# Patient Record
Sex: Female | Born: 1960 | Race: White | Hispanic: No | Marital: Married
Health system: Midwestern US, Academic
[De-identification: ages and names within clinical notes are randomized; demographics above are authoritative.]

---

## 2016-09-12 ENCOUNTER — Encounter: Admit: 2016-09-12 | Discharge: 2016-09-12 | Payer: MEDICARE

## 2016-09-12 NOTE — Progress Notes
SW received call from [place] with [facility] requesting update for patient's donor family (from 2015 transplant).  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    Lynetta Mare, LMSW

## 2016-09-20 ENCOUNTER — Encounter: Admit: 2016-09-20 | Discharge: 2016-09-20 | Payer: MEDICARE

## 2016-11-14 ENCOUNTER — Ambulatory Visit: Admit: 2016-11-13 | Discharge: 2016-11-14 | Payer: MEDICARE

## 2016-11-14 DIAGNOSIS — N186 End stage renal disease: Principal | ICD-10-CM

## 2016-11-15 ENCOUNTER — Encounter: Admit: 2016-11-15 | Discharge: 2016-11-15 | Payer: MEDICARE

## 2016-11-15 DIAGNOSIS — Z01818 Encounter for other preprocedural examination: ICD-10-CM

## 2016-11-15 DIAGNOSIS — N186 End stage renal disease: Principal | ICD-10-CM

## 2016-11-22 ENCOUNTER — Ambulatory Visit: Admit: 2016-11-22 | Discharge: 2016-11-22 | Payer: Medicare Other

## 2016-11-22 DIAGNOSIS — N186 End stage renal disease: Principal | ICD-10-CM

## 2016-11-22 DIAGNOSIS — Z01818 Encounter for other preprocedural examination: ICD-10-CM

## 2016-11-22 NOTE — Telephone Encounter
11/22/16- Received VM from pt. Per VM, she has sched stress and echo but has question about which test was ordered.  Attempted to contact pt, no answer and unable to leave VM.   Noted that pt is sched for echo today and stress on 9/19.

## 2016-11-25 ENCOUNTER — Encounter: Admit: 2016-11-25 | Discharge: 2016-11-25 | Payer: MEDICARE

## 2016-11-27 ENCOUNTER — Ambulatory Visit: Admit: 2016-11-27 | Discharge: 2016-11-27 | Payer: MEDICARE

## 2016-11-27 ENCOUNTER — Encounter: Admit: 2016-11-27 | Discharge: 2016-11-27 | Payer: MEDICARE

## 2016-11-27 DIAGNOSIS — N186 End stage renal disease: Principal | ICD-10-CM

## 2016-11-27 DIAGNOSIS — Z01818 Encounter for other preprocedural examination: ICD-10-CM

## 2016-11-27 MED ORDER — SODIUM CHLORIDE 0.9 % IV SOLP
250 mL | INTRAVENOUS | 0 refills | Status: AC | PRN
Start: 2016-11-27 — End: ?

## 2016-11-27 MED ORDER — NITROGLYCERIN 0.4 MG SL SUBL
.4 mg | SUBLINGUAL | 0 refills | Status: AC | PRN
Start: 2016-11-27 — End: ?

## 2016-11-27 MED ORDER — REGADENOSON 0.4 MG/5 ML IV SYRG
.4 mg | Freq: Once | INTRAVENOUS | 0 refills | Status: CP
Start: 2016-11-27 — End: ?
  Administered 2016-11-27: 16:00:00 0.4 mg via INTRAVENOUS

## 2016-11-27 MED ORDER — ALBUTEROL SULFATE 90 MCG/ACTUATION IN HFAA
2 | RESPIRATORY_TRACT | 0 refills | Status: DC | PRN
Start: 2016-11-27 — End: 2016-12-02

## 2016-11-27 MED ORDER — AMINOPHYLLINE 500 MG/20 ML IV SOLN
50 mg | INTRAVENOUS | 0 refills | Status: AC | PRN
Start: 2016-11-27 — End: ?
  Administered 2016-11-27: 16:00:00 25 mg via INTRAVENOUS

## 2016-11-28 ENCOUNTER — Encounter: Admit: 2016-11-28 | Discharge: 2016-11-28 | Payer: MEDICARE

## 2016-11-29 ENCOUNTER — Encounter: Admit: 2016-11-29 | Discharge: 2016-11-29 | Payer: MEDICARE

## 2016-12-03 NOTE — Telephone Encounter
12/03/16- Received letter from Dr. Trudee Kuster re: recent cardiac testing with the following note from Dr. Caryl Comes:  We are ok with annual stress/echo.

## 2016-12-04 ENCOUNTER — Encounter: Admit: 2016-12-04 | Discharge: 2016-12-04 | Payer: MEDICARE

## 2016-12-04 NOTE — Telephone Encounter
Reviewed CT only in selection committee. 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

## 2016-12-31 ENCOUNTER — Encounter: Admit: 2016-12-31 | Discharge: 2016-12-31 | Payer: MEDICARE

## 2017-01-21 ENCOUNTER — Encounter: Admit: 2017-01-21 | Discharge: 2017-01-21 | Payer: MEDICARE

## 2017-01-21 NOTE — Progress Notes
Records sent to [facility] for long term storage.  Box Number [id number]

## 2017-01-28 ENCOUNTER — Encounter: Admit: 2017-01-28 | Discharge: 2017-01-28 | Payer: MEDICARE

## 2017-01-28 NOTE — Telephone Encounter
Called pt to get her scheduled for an update appt no answer and no VM set up.

## 2017-01-28 NOTE — Telephone Encounter
Called patient to get update appointment scheduled.  No answer, phone rang continuously, unable to LVM.

## 2017-01-29 ENCOUNTER — Encounter: Admit: 2017-01-29 | Discharge: 2017-01-29 | Payer: MEDICARE

## 2017-01-29 NOTE — Telephone Encounter
Called pt to try and get her scheduled for an update appt no answer and no VM set up.

## 2017-02-03 ENCOUNTER — Encounter: Admit: 2017-02-03 | Discharge: 2017-02-03 | Payer: MEDICARE

## 2017-02-03 NOTE — Telephone Encounter
Tried to call pt to get her scheduled for an update appt no answer no VM set up.

## 2017-02-10 ENCOUNTER — Encounter: Admit: 2017-02-10 | Discharge: 2017-02-10 | Payer: MEDICARE

## 2017-02-10 NOTE — Telephone Encounter
Called pt to try and get her scheduled for an update appt no answer no VM set up.

## 2017-02-27 ENCOUNTER — Encounter: Admit: 2017-02-27 | Discharge: 2017-02-27 | Payer: MEDICARE

## 2017-02-27 NOTE — Telephone Encounter
Called to schedule patient for update appointment.  No answer and unable to leave VM on preferred number. Called mobile number and scheduled patient for 05/07/2017. Advised to be at clinic by 7:45, bring a support person, and that labs were not fasting labs so patient was encouraged to eat.  Patient verbalized understanding and had no other questions.

## 2017-03-11 IMAGING — CT Abdomen^1_ABDOMEN_PELVIS_WITHOUT (Adult)
1 series · 15 of 32 positions shown, 19 images · non-contrast
Comparison: none

[Series 2: abd/pelvis w/o 5.0 soft tissue · axial · non-contrast · 0.88mm/px · z∈[-408,+48]mm · 15 of 99 slices shown, 19 images]
[im 7/99  soft-tissue]
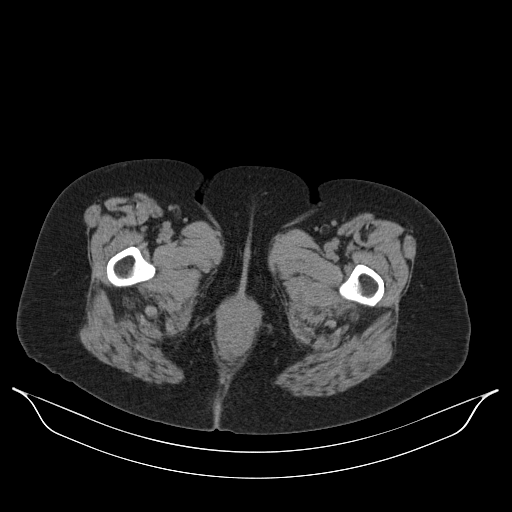
[im 7/99  bone]
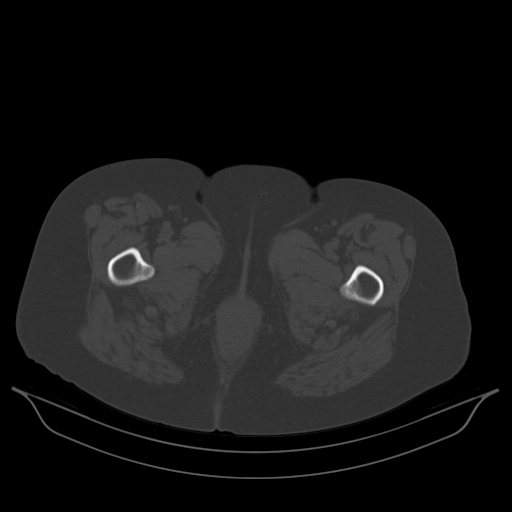
[im 13/99  soft-tissue]
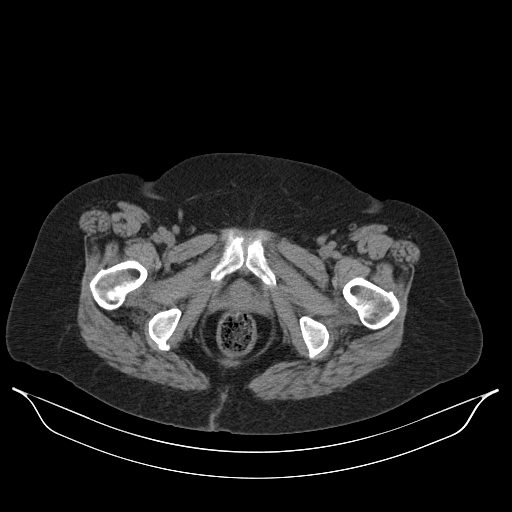
[im 19/99  soft-tissue]
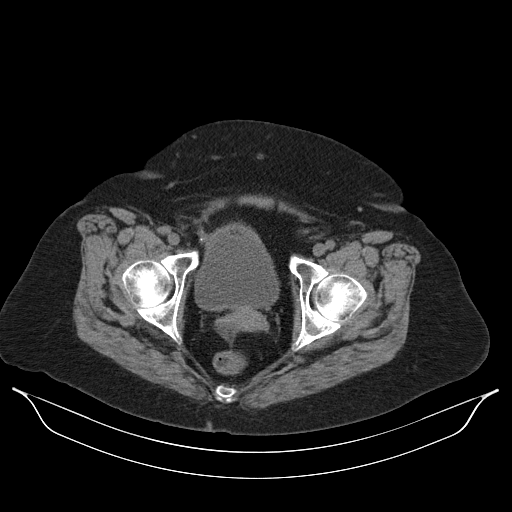
[im 29/99  soft-tissue]
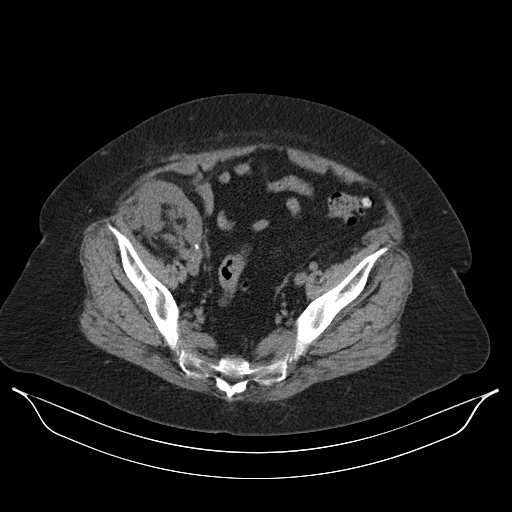
[im 35/99  soft-tissue]
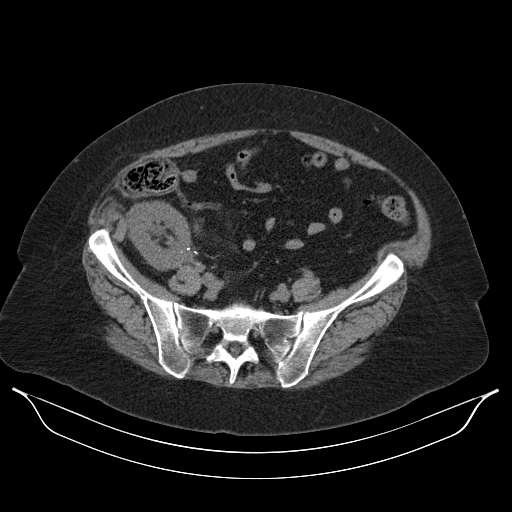
[im 42/99  soft-tissue]
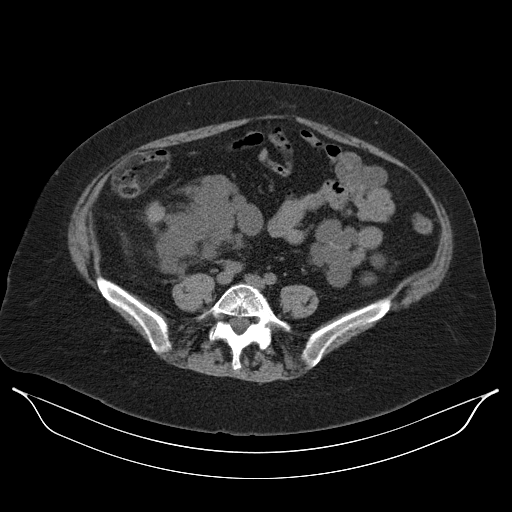
[im 51/99  soft-tissue]
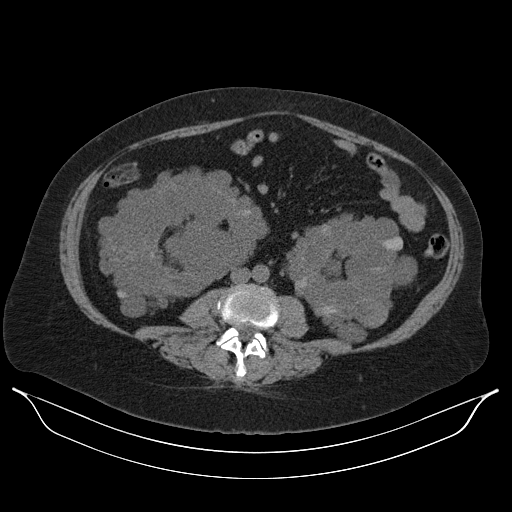
[im 57/99  soft-tissue]
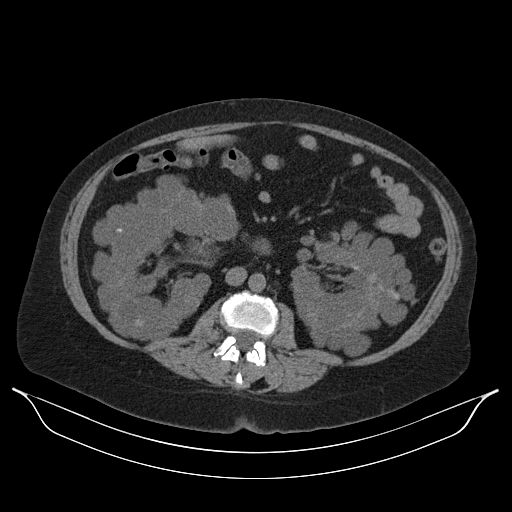
[im 64/99  soft-tissue]
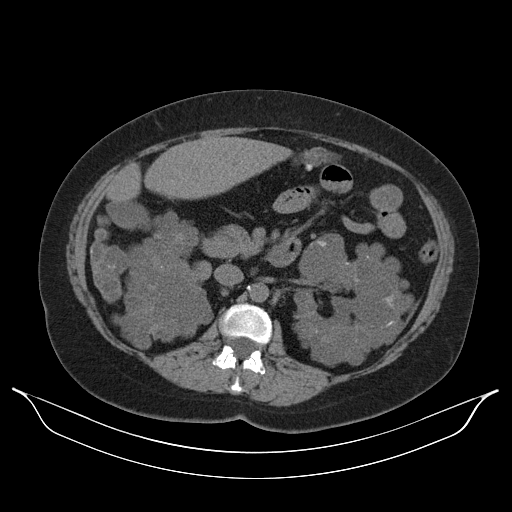
[im 64/99  bone]
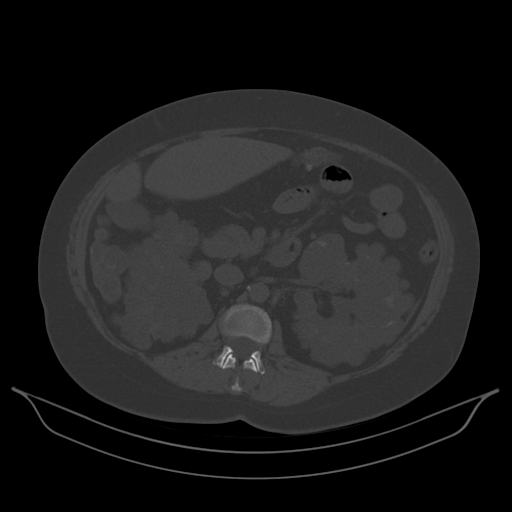
[im 70/99  soft-tissue]
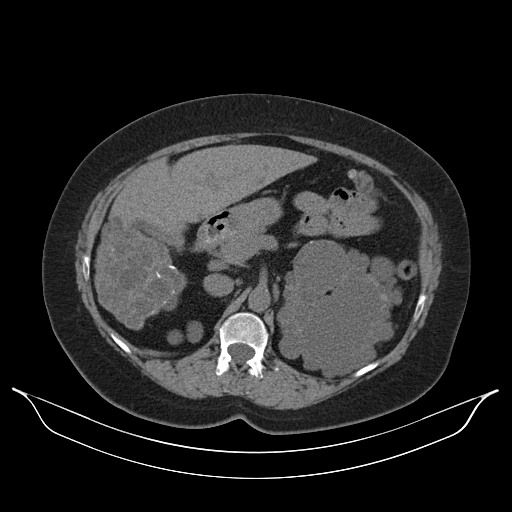
[im 80/99  soft-tissue]
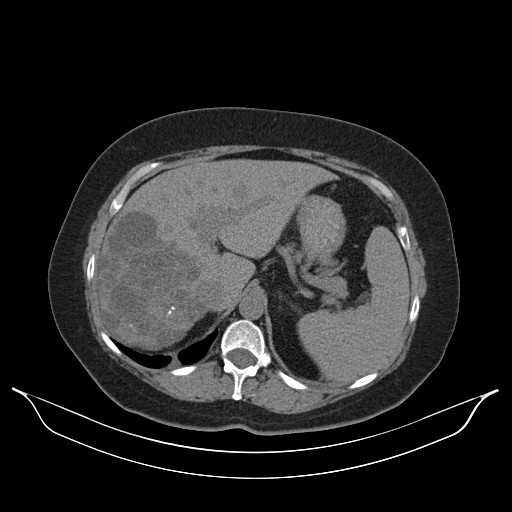
[im 86/99  soft-tissue]
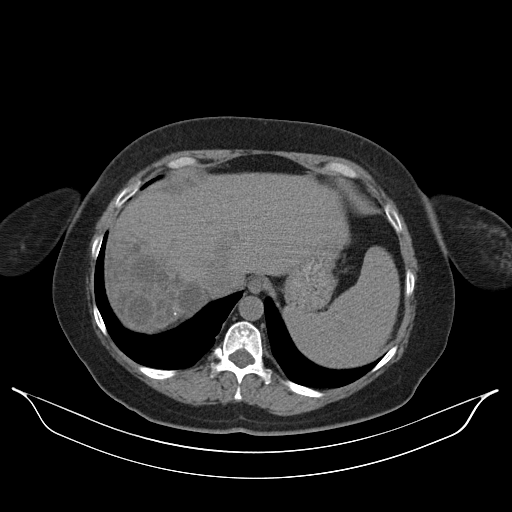
[im 86/99  lung]
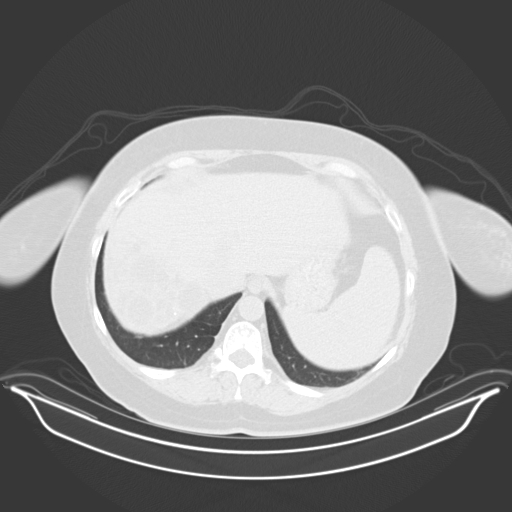
[im 89/99  lung]
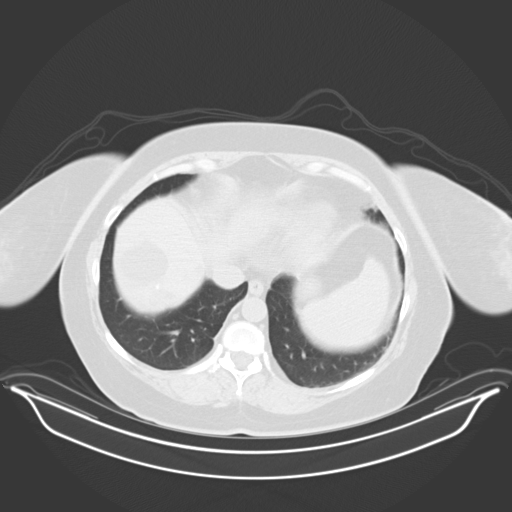
[im 92/99  soft-tissue]
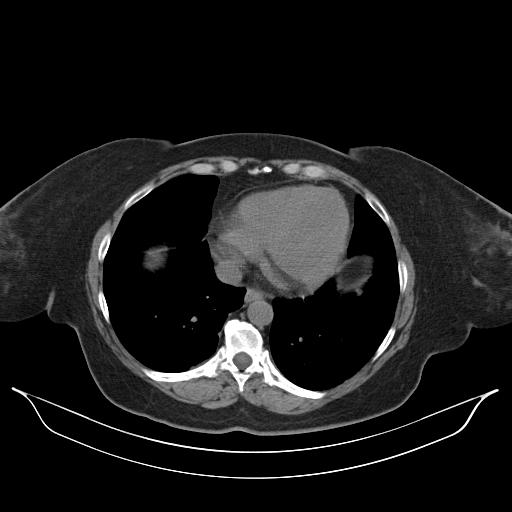
[im 92/99  lung]
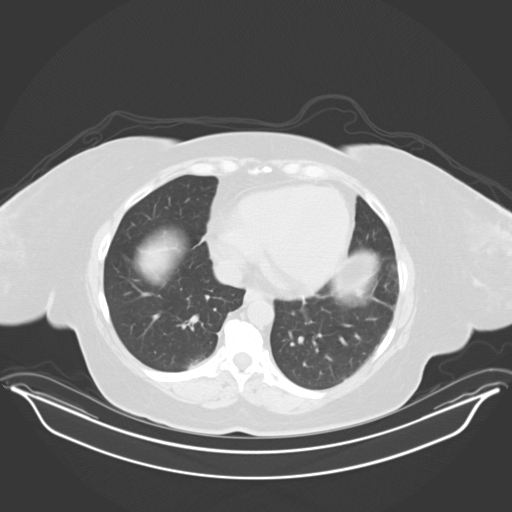
[im 95/99  lung]
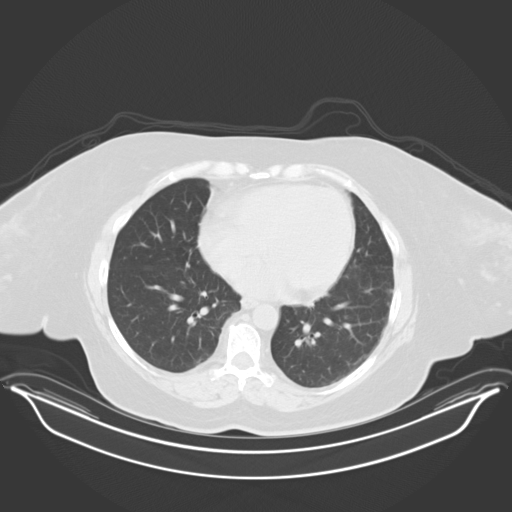

[15 of 32 positions shown; findings below may reference images not displayed]

DIAGNOSTIC STUDIES

EXAM
CT abdomen and pelvis without contrast

INDICATION
Right flank pain, hematuria; polycystic kidneys s/p transplant
PAIN SINCE 2AM.

TECHNIQUE
Volumetric multi detector CT images of the abdomen and pelvis were obtained without the
administration of IV contrast.

COMPARISONS
None available]

FINDINGS
The visualized lung bases are clear. There are markedly enlarged polycystic appearing bilateral
kidneys. There is additional evidence of extensive cystic disease within the right liver lobe with
scattered peripheral calcifications. There is a transplanted kidney identified in the right lower
quadrant. There is no definite evidence of obstructive uropathy of the implanted kidney. There is
colonic diverticulosis without evidence of diverticulitis. There is a cystic mass lesion seen
within the body of the pancreas measuring 1.1 centimeters in greatest dimension. There is no free
air or free fluid. There is no mesenteric or pelvic adenopathy. The appendix is normal in the right
lower quadrant. The remaining hollow and solid abdominal and pelvic viscera are grossly
unremarkable. There is no acute osseous abnormality. There is mild multilevel degenerative changes
of the lumbar spine.

IMPRESSION
1. Demonstration of large polycystic appearing bilateral kidneys with additional polycystic
disease of the right liver lobe.
2. No evidence of definite obstructive uropathy.
3. Demonstration of 1.1 centimeters cystic mass lesion within the mid body of the pancreas which
may represent a small mucinous neoplasm. Further evaluation with MRI can be useful for further
characterization.

## 2017-03-18 ENCOUNTER — Ambulatory Visit: Admit: 2017-03-18 | Discharge: 2017-03-19 | Payer: MEDICARE

## 2017-04-01 ENCOUNTER — Encounter: Admit: 2017-04-01 | Discharge: 2017-04-01 | Payer: MEDICARE

## 2017-04-03 ENCOUNTER — Encounter: Admit: 2017-04-03 | Discharge: 2017-04-03 | Payer: MEDICARE

## 2017-04-03 DIAGNOSIS — Z01818 Encounter for other preprocedural examination: Principal | ICD-10-CM

## 2017-04-18 ENCOUNTER — Encounter: Admit: 2017-04-18 | Discharge: 2017-04-18 | Payer: MEDICARE

## 2017-05-06 ENCOUNTER — Encounter: Admit: 2017-05-06 | Discharge: 2017-05-06 | Payer: MEDICARE

## 2017-05-07 ENCOUNTER — Ambulatory Visit: Admit: 2017-05-07 | Discharge: 2017-05-07 | Payer: MEDICARE

## 2017-05-07 ENCOUNTER — Encounter: Admit: 2017-05-07 | Discharge: 2017-05-07 | Payer: MEDICARE

## 2017-05-07 DIAGNOSIS — Z0389 Encounter for observation for other suspected diseases and conditions ruled out: ICD-10-CM

## 2017-05-07 DIAGNOSIS — Z01818 Encounter for other preprocedural examination: Principal | ICD-10-CM

## 2017-05-07 DIAGNOSIS — N186 End stage renal disease: ICD-10-CM

## 2017-05-07 DIAGNOSIS — Z91013 Allergy to seafood: ICD-10-CM

## 2017-05-07 DIAGNOSIS — Z9889 Other specified postprocedural states: ICD-10-CM

## 2017-05-07 DIAGNOSIS — I10 Essential (primary) hypertension: Secondary | ICD-10-CM

## 2017-05-07 DIAGNOSIS — Q613 Polycystic kidney, unspecified: ICD-10-CM

## 2017-05-07 DIAGNOSIS — E785 Hyperlipidemia, unspecified: ICD-10-CM

## 2017-05-07 DIAGNOSIS — E1129 Type 2 diabetes mellitus with other diabetic kidney complication: Secondary | ICD-10-CM

## 2017-05-07 DIAGNOSIS — Z114 Encounter for screening for human immunodeficiency virus [HIV]: ICD-10-CM

## 2017-05-07 DIAGNOSIS — Z992 Dependence on renal dialysis: ICD-10-CM

## 2017-05-07 LAB — URINALYSIS DIPSTICK
Lab: 1 (ref 1.003–1.035)
Lab: 5 (ref 5.0–8.0)
Lab: NEGATIVE
Lab: NEGATIVE
Lab: NEGATIVE
Lab: NEGATIVE
Lab: NEGATIVE
Lab: NEGATIVE
Lab: NEGATIVE

## 2017-05-07 LAB — PROTIME INR (PT): Lab: 1 (ref 0.8–1.2)

## 2017-05-07 LAB — HEMOGLOBIN A1C: Lab: 5.4 % (ref 4.0–6.0)

## 2017-05-07 LAB — HERPES SIMPLEX IGG AB (HSV IGG)
Lab: NEGATIVE
Lab: POSITIVE — AB

## 2017-05-07 LAB — CBC AND DIFF
Lab: 1 % — ABNORMAL LOW (ref >60)
Lab: 12 % — ABNORMAL LOW (ref 24–44)
Lab: 15 % — ABNORMAL HIGH (ref 11–15)
Lab: 30 pg (ref 26–34)
Lab: 37 % — ABNORMAL HIGH (ref 36–45)
Lab: 4 M/UL — ABNORMAL HIGH (ref 4.0–5.0)
Lab: 4.4 10*3/uL (ref 1.8–7.0)
Lab: 5.4 10*3/uL (ref 4.5–11.0)
Lab: 7.9 FL (ref 7–11)

## 2017-05-07 LAB — OPIATES-URINE RANDOM: Lab: NEGATIVE FL (ref 80–100)

## 2017-05-07 LAB — HIV 1& 2 AG-AB SCRN W REFLEX HIV 1 PCR QUANT: Lab: NEGATIVE

## 2017-05-07 LAB — TOXOPLASMA IGG: Lab: NEGATIVE

## 2017-05-07 LAB — CANNABINOIDS-URINE RANDOM: Lab: NEGATIVE 10*3/uL (ref 150–400)

## 2017-05-07 LAB — HEPATITIS B SURFACE AG: Lab: NEGATIVE

## 2017-05-07 LAB — BARBITURATES-URINE RANDOM: Lab: NEGATIVE % (ref 4–12)

## 2017-05-07 LAB — PHENCYCLIDINES-URINE RANDOM: Lab: NEGATIVE g/dL — ABNORMAL HIGH (ref 12.0–15.0)

## 2017-05-07 LAB — HEPATITIS B CORE AB TOT (IGG+IGM): Lab: NEGATIVE

## 2017-05-07 LAB — SYPHILIS AB SCREEN: Lab: NEGATIVE

## 2017-05-07 LAB — URINALYSIS, MICROSCOPIC

## 2017-05-07 LAB — COMPREHENSIVE METABOLIC PANEL
Lab: 138 MMOL/L (ref 137–147)
Lab: 4.1 MMOL/L (ref 3.5–5.1)

## 2017-05-07 LAB — PHOSPHORUS: Lab: 3.9 mg/dL (ref 2.0–4.5)

## 2017-05-07 LAB — HEPATITIS C ANTIBODY W REFLEX HCV PCR QUANT: Lab: NEGATIVE

## 2017-05-07 LAB — GGTP: Lab: 20 U/L (ref 9–64)

## 2017-05-07 LAB — AMPHETAMINES-URINE RANDOM: Lab: NEGATIVE % — ABNORMAL LOW (ref 0–2)

## 2017-05-07 LAB — BENZODIAZEPINES-URINE RANDOM: Lab: NEGATIVE % — ABNORMAL HIGH (ref 41–77)

## 2017-05-07 LAB — COCAINE-URINE RANDOM: Lab: NEGATIVE g/dL (ref 32.0–36.0)

## 2017-05-07 LAB — EPSTEIN BARR  PANEL(EBV)
Lab: POSITIVE
Lab: POSITIVE

## 2017-05-07 LAB — HEPATITIS B SURFACE AB: Lab: 62 m[IU]/mL

## 2017-05-07 LAB — TOXOPLASMA IGM: Lab: NEGATIVE

## 2017-05-08 ENCOUNTER — Encounter: Admit: 2017-05-08 | Discharge: 2017-05-08 | Payer: MEDICARE

## 2017-05-09 ENCOUNTER — Encounter: Admit: 2017-05-09 | Discharge: 2017-05-09 | Payer: MEDICARE

## 2017-05-13 ENCOUNTER — Encounter: Admit: 2017-05-13 | Discharge: 2017-05-13 | Payer: MEDICARE

## 2017-05-14 ENCOUNTER — Encounter: Admit: 2017-05-14 | Discharge: 2017-05-14 | Payer: MEDICARE

## 2017-05-14 DIAGNOSIS — Z992 Dependence on renal dialysis: ICD-10-CM

## 2017-05-14 DIAGNOSIS — I10 Essential (primary) hypertension: Secondary | ICD-10-CM

## 2017-05-14 DIAGNOSIS — Z91013 Allergy to seafood: ICD-10-CM

## 2017-05-14 DIAGNOSIS — Z9889 Other specified postprocedural states: ICD-10-CM

## 2017-05-14 DIAGNOSIS — E785 Hyperlipidemia, unspecified: ICD-10-CM

## 2017-05-14 DIAGNOSIS — N186 End stage renal disease: ICD-10-CM

## 2017-05-14 DIAGNOSIS — Q613 Polycystic kidney, unspecified: ICD-10-CM

## 2017-05-16 ENCOUNTER — Encounter: Admit: 2017-05-16 | Discharge: 2017-05-16 | Payer: MEDICARE

## 2017-05-26 ENCOUNTER — Encounter: Admit: 2017-05-26 | Discharge: 2017-05-26 | Payer: MEDICARE

## 2017-05-26 MED ORDER — PREDNISONE 5 MG PO TAB
5 mg | ORAL_TABLET | Freq: Every day | ORAL | 11 refills | Status: AC
Start: 2017-05-26 — End: 2018-06-17

## 2017-05-26 MED ORDER — TACROLIMUS 1 MG PO CAP
1 mg | ORAL_CAPSULE | Freq: Two times a day (BID) | ORAL | 11 refills | 30.00000 days | Status: AC
Start: 2017-05-26 — End: 2017-09-01

## 2017-05-26 MED ORDER — TACROLIMUS 0.5 MG PO CAP
.5 mg | ORAL_CAPSULE | Freq: Every day | ORAL | 11 refills | 30.00000 days | Status: AC
Start: 2017-05-26 — End: 2018-06-17

## 2017-05-30 ENCOUNTER — Encounter: Admit: 2017-05-30 | Discharge: 2017-05-30 | Payer: MEDICARE

## 2017-06-06 ENCOUNTER — Encounter: Admit: 2017-06-06 | Discharge: 2017-06-06 | Payer: MEDICARE

## 2017-06-16 ENCOUNTER — Ambulatory Visit: Admit: 2017-06-16 | Discharge: 2017-06-17 | Payer: MEDICARE

## 2017-06-17 DIAGNOSIS — N186 End stage renal disease: Principal | ICD-10-CM

## 2017-06-27 ENCOUNTER — Encounter: Admit: 2017-06-27 | Discharge: 2017-06-27 | Payer: MEDICARE

## 2017-07-01 ENCOUNTER — Encounter: Admit: 2017-07-01 | Discharge: 2017-07-01 | Payer: MEDICARE

## 2017-07-01 MED ORDER — AZATHIOPRINE 50 MG PO TAB
50 mg | ORAL_TABLET | Freq: Every day | ORAL | 11 refills | Status: AC
Start: 2017-07-01 — End: 2017-07-01

## 2017-07-01 MED ORDER — AZATHIOPRINE 50 MG PO TAB
50 mg | ORAL_TABLET | Freq: Every day | ORAL | 11 refills | Status: AC
Start: 2017-07-01 — End: 2018-06-17

## 2017-09-01 ENCOUNTER — Encounter: Admit: 2017-09-01 | Discharge: 2017-09-01 | Payer: MEDICARE

## 2017-09-01 MED ORDER — PROGRAF 1 MG PO CAP
1 mg | ORAL_CAPSULE | Freq: Two times a day (BID) | ORAL | 11 refills | 30.00000 days | Status: AC
Start: 2017-09-01 — End: 2018-06-17

## 2017-09-16 ENCOUNTER — Ambulatory Visit: Admit: 2017-09-16 | Discharge: 2017-09-17 | Payer: MEDICARE

## 2017-09-17 DIAGNOSIS — N186 End stage renal disease: Principal | ICD-10-CM

## 2017-10-14 ENCOUNTER — Encounter: Admit: 2017-10-14 | Discharge: 2017-10-14 | Payer: MEDICARE

## 2017-11-12 ENCOUNTER — Encounter: Admit: 2017-11-12 | Discharge: 2017-11-12 | Payer: MEDICARE

## 2017-12-29 ENCOUNTER — Encounter: Admit: 2017-12-29 | Discharge: 2017-12-29 | Payer: MEDICARE

## 2017-12-29 ENCOUNTER — Ambulatory Visit: Admit: 2017-12-29 | Discharge: 2017-12-30 | Payer: MEDICARE

## 2018-01-05 ENCOUNTER — Encounter: Admit: 2018-01-05 | Discharge: 2018-01-05 | Payer: MEDICARE

## 2018-02-11 ENCOUNTER — Ambulatory Visit: Admit: 2018-02-11 | Discharge: 2018-02-12 | Payer: MEDICARE

## 2018-02-16 ENCOUNTER — Encounter: Admit: 2018-02-16 | Discharge: 2018-02-16 | Payer: MEDICARE

## 2018-02-16 DIAGNOSIS — Z01818 Encounter for other preprocedural examination: Principal | ICD-10-CM

## 2018-02-16 DIAGNOSIS — N186 End stage renal disease: ICD-10-CM

## 2018-02-19 ENCOUNTER — Encounter: Admit: 2018-02-19 | Discharge: 2018-02-19 | Payer: MEDICARE

## 2018-02-27 ENCOUNTER — Encounter: Admit: 2018-02-27 | Discharge: 2018-02-27 | Payer: MEDICARE

## 2018-02-27 DIAGNOSIS — Z005 Encounter for examination of potential donor of organ and tissue: Principal | ICD-10-CM

## 2018-03-05 ENCOUNTER — Encounter: Admit: 2018-03-05 | Discharge: 2018-03-05 | Payer: MEDICARE

## 2018-03-20 ENCOUNTER — Encounter: Admit: 2018-03-20 | Discharge: 2018-03-20 | Payer: MEDICARE

## 2018-03-23 ENCOUNTER — Ambulatory Visit: Admit: 2018-03-23 | Discharge: 2018-03-23

## 2018-03-23 ENCOUNTER — Ambulatory Visit: Admit: 2018-03-23 | Discharge: 2018-03-24 | Payer: MEDICARE

## 2018-03-23 DIAGNOSIS — Z01818 Encounter for other preprocedural examination: Secondary | ICD-10-CM

## 2018-03-23 DIAGNOSIS — N186 End stage renal disease: Secondary | ICD-10-CM

## 2018-03-23 MED ORDER — SODIUM CHLORIDE 0.9 % IV SOLP
250 mL | INTRAVENOUS | 0 refills | Status: AC | PRN
Start: 2018-03-23 — End: ?

## 2018-03-23 MED ORDER — BENZOCAINE-MENTHOL 6-10 MG MM LOZG
1 | Freq: Once | ORAL | 0 refills | Status: AC | PRN
Start: 2018-03-23 — End: ?

## 2018-03-23 MED ORDER — PERFLUTREN LIPID MICROSPHERES 1.1 MG/ML IV SUSP
1-20 mL | Freq: Once | INTRAVENOUS | 0 refills | Status: CP | PRN
Start: 2018-03-23 — End: ?
  Administered 2018-03-23: 16:00:00 3 mL via INTRAVENOUS

## 2018-03-23 MED ORDER — REGADENOSON 0.4 MG/5 ML IV SYRG
.4 mg | Freq: Once | INTRAVENOUS | 0 refills | Status: CP
Start: 2018-03-23 — End: ?
  Administered 2018-03-23: 17:00:00 0.4 mg via INTRAVENOUS

## 2018-03-23 MED ORDER — ALBUTEROL SULFATE 90 MCG/ACTUATION IN HFAA
2 | RESPIRATORY_TRACT | 0 refills | Status: DC | PRN
Start: 2018-03-23 — End: 2018-03-28

## 2018-03-23 MED ORDER — NITROGLYCERIN 0.4 MG SL SUBL
.4 mg | SUBLINGUAL | 0 refills | Status: AC | PRN
Start: 2018-03-23 — End: ?

## 2018-03-23 MED ORDER — AMINOPHYLLINE 500 MG/20 ML IV SOLN
50 mg | INTRAVENOUS | 0 refills | Status: AC | PRN
Start: 2018-03-23 — End: ?
  Administered 2018-03-23: 17:00:00 50 mg via INTRAVENOUS

## 2018-03-25 ENCOUNTER — Encounter: Admit: 2018-03-25 | Discharge: 2018-03-25 | Payer: MEDICARE

## 2018-03-25 ENCOUNTER — Ambulatory Visit: Admit: 2018-03-25 | Discharge: 2018-03-25

## 2018-03-25 ENCOUNTER — Ambulatory Visit: Admit: 2018-03-25 | Discharge: 2018-03-26

## 2018-03-25 DIAGNOSIS — E785 Hyperlipidemia, unspecified: Secondary | ICD-10-CM

## 2018-03-25 DIAGNOSIS — Z114 Encounter for screening for human immunodeficiency virus [HIV]: Secondary | ICD-10-CM

## 2018-03-25 DIAGNOSIS — Q613 Polycystic kidney, unspecified: Secondary | ICD-10-CM

## 2018-03-25 DIAGNOSIS — N186 End stage renal disease: Secondary | ICD-10-CM

## 2018-03-25 DIAGNOSIS — Z01818 Encounter for other preprocedural examination: Secondary | ICD-10-CM

## 2018-03-25 DIAGNOSIS — Z9889 Other specified postprocedural states: Secondary | ICD-10-CM

## 2018-03-25 DIAGNOSIS — I10 Essential (primary) hypertension: Principal | ICD-10-CM

## 2018-03-25 DIAGNOSIS — Z992 Dependence on renal dialysis: Secondary | ICD-10-CM

## 2018-03-25 DIAGNOSIS — Z91013 Allergy to seafood: Secondary | ICD-10-CM

## 2018-03-25 LAB — PROTIME INR (PT): Lab: 1 (ref 0.8–1.2)

## 2018-03-25 LAB — COMPREHENSIVE METABOLIC PANEL
Lab: 142 MMOL/L (ref 137–147)
Lab: 3.7 MMOL/L (ref 3.5–5.1)
Lab: 5 mg/dL — ABNORMAL HIGH (ref 0.4–1.00)
Lab: 96 mg/dL (ref 70–100)

## 2018-03-25 LAB — HERPES SIMPLEX IGG AB (HSV IGG)
Lab: NEGATIVE
Lab: POSITIVE — AB

## 2018-03-25 LAB — CBC AND DIFF
Lab: 0.1 10*3/uL (ref 0–0.20)
Lab: 0.1 10*3/uL (ref 0–0.45)
Lab: 0.4 10*3/uL (ref 0–0.80)
Lab: 0.8 10*3/uL — ABNORMAL LOW (ref 1.0–4.8)
Lab: 1 % (ref 0–2)
Lab: 1 % (ref 0–5)
Lab: 13 g/dL (ref 12.0–15.0)
Lab: 14 % — ABNORMAL LOW (ref 24–44)
Lab: 16 % — ABNORMAL HIGH (ref 11–15)
Lab: 181 10*3/uL (ref 150–400)
Lab: 30 pg (ref 26–34)
Lab: 33 g/dL (ref 32.0–36.0)
Lab: 4.5 10*3/uL (ref 1.8–7.0)
Lab: 4.5 M/UL (ref 4.0–5.0)
Lab: 41 % (ref 36–45)
Lab: 5.9 10*3/uL (ref 4.5–11.0)
Lab: 8.1 FL — ABNORMAL LOW (ref >60)
Lab: 91 FL (ref 80–100)

## 2018-03-25 LAB — URINALYSIS DIPSTICK
Lab: 1 g/dL — ABNORMAL LOW (ref 1.003–1.035)
Lab: 5 mg/dL (ref 5.0–8.0)
Lab: NEGATIVE U/L (ref 7–40)
Lab: NEGATIVE g/dL — ABNORMAL HIGH (ref 3.5–5.0)

## 2018-03-25 LAB — HEPATITIS B SURFACE AB: Lab: 31 m[IU]/mL

## 2018-03-25 LAB — NON-PAIN TREATMENT URINE DRUG SCREEN PANEL
Lab: NEGATIVE
Lab: NEGATIVE
Lab: NEGATIVE
Lab: NEGATIVE
Lab: NEGATIVE
Lab: NEGATIVE

## 2018-03-25 LAB — HIV 1& 2 AG-AB SCRN W REFLEX HIV 1 PCR QUANT: Lab: NEGATIVE

## 2018-03-25 LAB — URINALYSIS, MICROSCOPIC

## 2018-03-25 LAB — GGTP: Lab: 22 U/L (ref 9–64)

## 2018-03-25 LAB — CMV AB IGG: Lab: POSITIVE mg/dL — ABNORMAL HIGH (ref 7–25)

## 2018-03-25 LAB — TOXOPLASMA IGM: Lab: NEGATIVE

## 2018-03-25 LAB — EPSTEIN BARR  PANEL(EBV)
Lab: POSITIVE
Lab: POSITIVE

## 2018-03-25 LAB — PHOSPHORUS: Lab: 4 mg/dL (ref 2.0–4.5)

## 2018-03-25 LAB — HEPATITIS B SURFACE AG: Lab: NEGATIVE

## 2018-03-25 LAB — HEPATITIS C ANTIBODY W REFLEX HCV PCR QUANT: Lab: NEGATIVE

## 2018-03-25 LAB — HEPATITIS B CORE AB TOT (IGG+IGM): Lab: NEGATIVE

## 2018-03-25 LAB — TOXOPLASMA IGG: Lab: NEGATIVE

## 2018-03-25 LAB — PARATHYROID HORMONE: Lab: 559 pg/mL — ABNORMAL HIGH (ref 10–65)

## 2018-03-26 ENCOUNTER — Encounter: Admit: 2018-03-26 | Discharge: 2018-03-26 | Payer: MEDICARE

## 2018-03-26 DIAGNOSIS — Z9889 Other specified postprocedural states: Secondary | ICD-10-CM

## 2018-03-26 DIAGNOSIS — Q613 Polycystic kidney, unspecified: Secondary | ICD-10-CM

## 2018-03-26 DIAGNOSIS — Z992 Dependence on renal dialysis: Secondary | ICD-10-CM

## 2018-03-26 DIAGNOSIS — I10 Essential (primary) hypertension: Principal | ICD-10-CM

## 2018-03-26 DIAGNOSIS — N186 End stage renal disease: Secondary | ICD-10-CM

## 2018-03-26 DIAGNOSIS — E785 Hyperlipidemia, unspecified: Secondary | ICD-10-CM

## 2018-03-26 DIAGNOSIS — Z91013 Allergy to seafood: Secondary | ICD-10-CM

## 2018-03-26 DIAGNOSIS — Z01818 Encounter for other preprocedural examination: Secondary | ICD-10-CM

## 2018-03-26 DIAGNOSIS — Z114 Encounter for screening for human immunodeficiency virus [HIV]: Secondary | ICD-10-CM

## 2018-03-26 LAB — SYPHILIS AB SCREEN: Lab: NEGATIVE

## 2018-03-26 LAB — CMV AB IGM: Lab: NEGATIVE MMOL/L (ref 98–110)

## 2018-03-27 ENCOUNTER — Encounter: Admit: 2018-03-27 | Discharge: 2018-03-27 | Payer: MEDICARE

## 2018-04-03 ENCOUNTER — Encounter: Admit: 2018-04-03 | Discharge: 2018-04-03 | Payer: MEDICARE

## 2018-04-09 ENCOUNTER — Encounter: Admit: 2018-04-09 | Discharge: 2018-04-09 | Payer: MEDICARE

## 2018-04-23 ENCOUNTER — Encounter: Admit: 2018-04-23 | Discharge: 2018-04-23 | Payer: MEDICARE

## 2018-04-24 ENCOUNTER — Encounter: Admit: 2018-04-24 | Discharge: 2018-04-24 | Payer: MEDICARE

## 2018-04-26 ENCOUNTER — Encounter: Admit: 2018-04-26 | Discharge: 2018-04-26 | Payer: MEDICARE

## 2018-04-26 NOTE — Telephone Encounter
Received organ offer from [employer] number [id number] / Match Run number [id number].  Reviewed donor summary and match run list.  Organ donor has KDPI of 41%, patient notified.  Organ offer is donation after circulatory death and increased risk.  Reviewed recipients informed consent visit form.  Diana Cooley consented to [facility] less thant 85%.  Promiss is back up status.  Reviewed recipient's history.  Notified Diana Cooley of organ offer, organ type and current status on list.  Diana Cooley declined offer because of increased risk. [facility] notified

## 2018-04-26 NOTE — Telephone Encounter
Pt under the impression she signed that she did not want increased risk kidneys. Educated pt as that is not an option on Education Checklist. Will have pt's NC, Kelly, discuss further with pt if necessary.

## 2018-05-04 ENCOUNTER — Encounter: Admit: 2018-05-04 | Discharge: 2018-05-04 | Payer: MEDICARE

## 2018-05-06 ENCOUNTER — Encounter: Admit: 2018-05-06 | Discharge: 2018-05-06 | Payer: MEDICARE

## 2018-05-06 DIAGNOSIS — I10 Essential (primary) hypertension: Principal | ICD-10-CM

## 2018-05-06 DIAGNOSIS — E785 Hyperlipidemia, unspecified: ICD-10-CM

## 2018-05-06 DIAGNOSIS — I749 Embolism and thrombosis of unspecified artery: ICD-10-CM

## 2018-05-06 DIAGNOSIS — Q613 Polycystic kidney, unspecified: ICD-10-CM

## 2018-05-06 DIAGNOSIS — N186 End stage renal disease: ICD-10-CM

## 2018-05-06 DIAGNOSIS — F4321 Adjustment disorder with depressed mood: Principal | ICD-10-CM

## 2018-05-06 DIAGNOSIS — Z992 Dependence on renal dialysis: ICD-10-CM

## 2018-05-06 DIAGNOSIS — D649 Anemia, unspecified: ICD-10-CM

## 2018-05-06 DIAGNOSIS — Z91013 Allergy to seafood: ICD-10-CM

## 2018-05-06 DIAGNOSIS — Z9889 Other specified postprocedural states: ICD-10-CM

## 2018-05-06 MED ORDER — VENLAFAXINE 37.5 MG PO TAB
ORAL_TABLET | Freq: Two times a day (BID) | 0 refills | Status: AC
Start: 2018-05-06 — End: 2018-07-07

## 2018-05-06 NOTE — Progress Notes
I spoke with Diana Cooley briefly on 05/06/2018 as a result of her appointment with Dr. Gerhard Munch on 05/06/2018. PCP is Safavi, Sahar. At the provider???s request, I introduced myself to her and familiarized her with the behavioral health resources in our clinic.    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  She is on the transplant list again.  She uses at-home dialysis with help from her spouse.  She endorsed a family history of polycystic kidney disease that resulted in her father's death and the death of her sister (after a failed kidney transplant).  Pt's daughter was also recently diagnosed with the same condition.      Pt reportedly saw a psychiatrist and has trialed three antidepressants with no benefit.  He added rexulti, which made her drowsy all day, then did not offer other approaches.  Pt denied hx of previous counseling.  Her dialysis center's social worker tried to connect her with psychotherapy in the community, but the providers Pt contacted either did not accept her insurance or were not taking new patients.  Pt reportedly has felt defeated and frustrated.  She shared of being contacted multiple times by the transplant team when at her part-time job only to learn that she was fourth in line and moved to third in line (why call me when I won't even get it) and then learned it was a high risk donor, which she had recently marked on a form during a follow-up visit that she was disinterested in given her previous failed transplant.      Provided supportive counseling to validate Pt's anticipatory grief and frustration and sense of lack of control over her medical condition and transplant list status.  Pt tends to avoid sharing with her spouse and daughter, so as to not add stress, burden, or worry to them.  Provided feedback to Pt that this can sometimes lead in more sense of isolation when both parties are grieving alone rather than connecting over the grief

## 2018-05-07 ENCOUNTER — Encounter: Admit: 2018-05-07 | Discharge: 2018-05-07 | Payer: MEDICARE

## 2018-05-07 ENCOUNTER — Ambulatory Visit: Admit: 2018-05-06 | Discharge: 2018-05-07

## 2018-05-07 DIAGNOSIS — F4321 Adjustment disorder with depressed mood: ICD-10-CM

## 2018-05-07 DIAGNOSIS — Z7689 Persons encountering health services in other specified circumstances: Secondary | ICD-10-CM

## 2018-05-07 DIAGNOSIS — F339 Major depressive disorder, recurrent, unspecified: Principal | ICD-10-CM

## 2018-05-07 DIAGNOSIS — N186 End stage renal disease: ICD-10-CM

## 2018-05-07 DIAGNOSIS — Z01818 Encounter for other preprocedural examination: ICD-10-CM

## 2018-05-07 DIAGNOSIS — Z1211 Encounter for screening for malignant neoplasm of colon: ICD-10-CM

## 2018-05-07 NOTE — Telephone Encounter
NC rec'd email from pt stating that her dialysis center had not yet received the updated surgical note from her clinic visit.   I responded to the email to let Diana Cooley know that I would also fax the note to her dialysis center.     Dr. Helaine Chess surgical note printed and faxed to [facility].

## 2018-05-10 ENCOUNTER — Encounter: Admit: 2018-05-10 | Discharge: 2018-05-10 | Payer: MEDICARE

## 2018-05-10 DIAGNOSIS — E785 Hyperlipidemia, unspecified: Secondary | ICD-10-CM

## 2018-05-10 DIAGNOSIS — Z9889 Other specified postprocedural states: ICD-10-CM

## 2018-05-10 DIAGNOSIS — Q613 Polycystic kidney, unspecified: ICD-10-CM

## 2018-05-10 DIAGNOSIS — Z992 Dependence on renal dialysis: ICD-10-CM

## 2018-05-10 DIAGNOSIS — D649 Anemia, unspecified: ICD-10-CM

## 2018-05-10 DIAGNOSIS — I749 Embolism and thrombosis of unspecified artery: ICD-10-CM

## 2018-05-10 DIAGNOSIS — Z91013 Allergy to seafood: ICD-10-CM

## 2018-05-10 DIAGNOSIS — I10 Essential (primary) hypertension: Principal | ICD-10-CM

## 2018-05-10 DIAGNOSIS — N186 End stage renal disease: ICD-10-CM

## 2018-05-12 ENCOUNTER — Encounter: Admit: 2018-05-12 | Discharge: 2018-05-12 | Payer: MEDICARE

## 2018-05-13 NOTE — Telephone Encounter
Called patient to inform her that Dr. Gerhard Munch reached out to transplant coordinator and no CT is needed of abdomen or pelvis. Also advised her that updated surgical records were sent to her dialysis center via fax. Patient understood and had no further question/concerns at this time. Susa Raring, RN

## 2018-06-01 ENCOUNTER — Encounter: Admit: 2018-06-01 | Discharge: 2018-06-01 | Payer: MEDICARE

## 2018-06-17 ENCOUNTER — Encounter: Admit: 2018-06-17 | Discharge: 2018-06-17 | Payer: MEDICARE

## 2018-06-17 DIAGNOSIS — D899 Disorder involving the immune mechanism, unspecified: Principal | ICD-10-CM

## 2018-06-17 MED ORDER — AZATHIOPRINE 50 MG PO TAB
50 mg | ORAL_TABLET | Freq: Every day | ORAL | 11 refills | Status: DC
Start: 2018-06-17 — End: 2018-11-17

## 2018-06-17 MED ORDER — PROGRAF 1 MG PO CAP
1 mg | ORAL_CAPSULE | Freq: Two times a day (BID) | ORAL | 11 refills | 30.00000 days | Status: DC
Start: 2018-06-17 — End: 2018-06-17

## 2018-06-17 MED ORDER — TACROLIMUS 1 MG PO CAP
1 mg | ORAL_CAPSULE | Freq: Two times a day (BID) | ORAL | 11 refills | 30.00000 days | Status: DC
Start: 2018-06-17 — End: 2018-11-12

## 2018-06-17 MED ORDER — PREDNISONE 5 MG PO TAB
5 mg | ORAL_TABLET | Freq: Every day | ORAL | 11 refills | Status: DC
Start: 2018-06-17 — End: 2018-11-17

## 2018-06-17 MED ORDER — TACROLIMUS 0.5 MG PO CAP
.5 mg | ORAL_CAPSULE | Freq: Every day | ORAL | 11 refills | 30.00000 days | Status: DC
Start: 2018-06-17 — End: 2018-11-12

## 2018-06-17 NOTE — Telephone Encounter
1554 This NC spoke with Leotis Shames at [facility] and clarified tacrolimus script order.    Gearlean Alf, RN  Pager [phone]

## 2018-06-18 ENCOUNTER — Encounter: Admit: 2018-06-18 | Discharge: 2018-06-18 | Payer: MEDICARE

## 2018-06-18 NOTE — Telephone Encounter
BENEFIT COLLECTION: Benefit Collection 2020 Update      Verified by: Margaretha Glassing                                               Date: Jun 18, 2018  ID #:   [id number]          EFF:10/10/2006 A&B                                Subscriber:Self  Ins Plan:  Medicare A&B                     Phone#: [phone]                                          Plan Type:  Traditional   MEDICARE A&B - 2020 BENEFIT SUMMARY:  PART A: DAYS REFRESH AFTER 60 CONSECUTIVE OUTPT DAYS  DAYS 1-60 $1,408.00 DEDUCTIBLE  DAYS 61-90 $352.00/DAY COPAY  DAYS 91-150 (USING ANY LIFETIME RESERVE DAYS) $704.00/DAY COPAY  DAYS > 150 PT PORTION 100%; after a 3-day minimum medically necessary inpatient hospital stay for a related illness or injury.  PART A/ SNF BENEFITS: IN 2020: DAYS 1-20 PT PORTION $0; DAYS 21-100 $176.00/DAY COPAY; DAYS > 100 PT PORTION 100%  PART B IN 2020:  DEDUCTIBLE $198.00, WITH 80% REIMBURSEMENT OF ALLOWABLE, NO OUT OF POCKET MAXIMUM  MEDICARE WILL PAY FOR DRUGS INFUSED THROUGH AN ITEM OF DME, LIKE AN INFUSION PUMP, OR DRUGS GIVEN BY A NEBULIZER   WHEN GIVEN BY A LICENSED MEDICAL PROVIDER.  PART B WILL COVER IMMUNOSUPPRESSIVE DRUGS IF [facility] COVERED THE TRANSPLANT, EVEN AS SECONDARY PAYER  No Case Management, No transplant Network, No prior authorizations, No Transplant Max, No Travel and Lodging, No BDCT requirement.  [employer] Reference Number: [id number]      PART D:  RX Plan:  [facility] RX Saver                                                                 Phone #: [phone]  Monthly Premium: $32.40  Drug Copay/ Coinsurance: $2 - $120, 25% - 42%  Current Subsidy: No Extra Help  Valcyte:Not Covered   Generic:Tier 5 Drug - 25% of Cost   Annual Drug Deductible: $435.00  Coverage gap in 2020:  In stage 1 pt pays as plan requires until total paid out = $4020.00  Then in Stage 2 coverage gap, pt pays 37% of cost of generics, 25% of cost of brand names until total paid out = $6350.00 (pt and Plan) Patient responsibility in gap is approximately 50% of $2330 = $1165.00  In Stage 3 catastrophic pt will pay as plan requires (small copays or coinsurances)    SECONDARY:  ID #: [id number]  GR#: [id number]                                Subscriber:Self  Ins Plan:  [employer]             EFF:08/10/2011               Phone#: [phone]                                            Plan Type:  Supplement F  MEDICARE SUPPLEMENT PLAN F  COVERS PT A [facility] COSTS UP TO AN ADDITIONAL 365 DAYS AFTER MEDICARE BENEFITS ARE USED UP,  MEDICARE PART B COINSURANCE OR COPAYMENT,  FIRST 3 PINTS OF BLOOD  PART A HOSPICE CARE COINSURANCE OR COPAYMENT  SKILLED NURSING FACILITY COINSURANCE  PART A DEDUCTIBLE  PART B DEDUCTIBLE  MEDICARE PREVENTIVE CARE PART B COINSURANCE  PART B EXCESS CHGS,  FOREIGN TRAVEL EMERGENCY UP TO PLAN LIMITS  MEDICARE PREVENTIVE CARE PART B COINSURANCE  [employer] Reference Number: [id number]

## 2018-06-18 NOTE — Telephone Encounter
06/18/2018 Verified active coverage via [employer].   MDCR A&B - [employer] Reference Number: [id number]  [facility] Reference Number: [phone]

## 2018-07-06 ENCOUNTER — Encounter: Admit: 2018-07-06 | Discharge: 2018-07-06 | Payer: MEDICARE

## 2018-07-07 MED ORDER — VENLAFAXINE 37.5 MG PO TAB
37.5 mg | ORAL_TABLET | Freq: Every day | ORAL | 0 refills | Status: DC
Start: 2018-07-07 — End: 2018-07-08

## 2018-07-07 NOTE — Telephone Encounter
Refill request from [facility] for venlafaxine 37.5mg  tab.    Last fill: 05/06/2018  Qty/Refill: #60/0  LOV:05/06/2018  NOV: none scheuled    Medication not on General Medicine protocol list. Will route to Dr. Gerhard Munch to approve/deny.     Susa Raring, RN

## 2018-07-08 ENCOUNTER — Encounter: Admit: 2018-07-08 | Discharge: 2018-07-08 | Payer: MEDICARE

## 2018-07-08 ENCOUNTER — Ambulatory Visit: Admit: 2018-07-08 | Discharge: 2018-07-09 | Payer: Commercial Managed Care - HMO

## 2018-07-08 DIAGNOSIS — Q613 Polycystic kidney, unspecified: ICD-10-CM

## 2018-07-08 DIAGNOSIS — N186 End stage renal disease: ICD-10-CM

## 2018-07-08 DIAGNOSIS — Z9889 Other specified postprocedural states: ICD-10-CM

## 2018-07-08 DIAGNOSIS — I749 Embolism and thrombosis of unspecified artery: ICD-10-CM

## 2018-07-08 DIAGNOSIS — Z91013 Allergy to seafood: ICD-10-CM

## 2018-07-08 DIAGNOSIS — Z992 Dependence on renal dialysis: ICD-10-CM

## 2018-07-08 DIAGNOSIS — E785 Hyperlipidemia, unspecified: ICD-10-CM

## 2018-07-08 DIAGNOSIS — I10 Essential (primary) hypertension: Principal | ICD-10-CM

## 2018-07-08 DIAGNOSIS — D649 Anemia, unspecified: ICD-10-CM

## 2018-07-08 MED ORDER — VENLAFAXINE 37.5 MG PO TAB
37.5 mg | ORAL_TABLET | Freq: Every day | ORAL | 5 refills | Status: DC
Start: 2018-07-08 — End: 2018-11-17

## 2018-07-09 DIAGNOSIS — F4323 Adjustment disorder with mixed anxiety and depressed mood: Principal | ICD-10-CM

## 2018-07-09 DIAGNOSIS — I10 Essential (primary) hypertension: ICD-10-CM

## 2018-07-09 DIAGNOSIS — Z09 Encounter for follow-up examination after completed treatment for conditions other than malignant neoplasm: ICD-10-CM

## 2018-07-15 ENCOUNTER — Encounter: Admit: 2018-07-15 | Discharge: 2018-07-15 | Payer: MEDICARE

## 2018-07-15 ENCOUNTER — Ambulatory Visit: Admit: 2018-07-15 | Discharge: 2018-07-15 | Payer: MEDICARE

## 2018-07-15 DIAGNOSIS — Z0100 Encounter for examination of eyes and vision without abnormal findings: Secondary | ICD-10-CM

## 2018-07-15 DIAGNOSIS — Z91013 Allergy to seafood: ICD-10-CM

## 2018-07-15 DIAGNOSIS — I10 Essential (primary) hypertension: Principal | ICD-10-CM

## 2018-07-15 DIAGNOSIS — E785 Hyperlipidemia, unspecified: ICD-10-CM

## 2018-07-15 DIAGNOSIS — D649 Anemia, unspecified: ICD-10-CM

## 2018-07-15 DIAGNOSIS — I749 Embolism and thrombosis of unspecified artery: ICD-10-CM

## 2018-07-15 DIAGNOSIS — N186 End stage renal disease: ICD-10-CM

## 2018-07-15 DIAGNOSIS — Z992 Dependence on renal dialysis: ICD-10-CM

## 2018-07-15 DIAGNOSIS — Z0000 Encounter for general adult medical examination without abnormal findings: Principal | ICD-10-CM

## 2018-07-15 DIAGNOSIS — Z9889 Other specified postprocedural states: ICD-10-CM

## 2018-07-15 DIAGNOSIS — Q613 Polycystic kidney, unspecified: ICD-10-CM

## 2018-07-15 NOTE — Progress Notes
Pneumococcal (Pneumonia) Vaccine     Influenza (Flu) Vaccine ? Pneumonia: 1-2 doses up to age 58;  ? Pneumonia: 1 dose age 16+  Influenza: Annually 12/2016     MALES: Abdominal Aortic Aneurysm ? Once, between the age range of 69-75 and smoked 100+ cigarettes in lifetime N/A   FEMALES: Breast Cancer Screening (Mammogram) Every 1-2 yrs, aged 44-74 yrs 12/03/2017- outside records- [facility]   FEMALES: Cervical Cancer Screening (Pap Smear) ? Every 3 yrs, aged 21-64 yrs;  Every 5 yrs, aged 85-65 with HPV testing 06/09/2017   FEMALES: Osteoporosis Screening (Bone Density Measurement) ? Routinely, for women aged 65+ every 2 years  ? Routinely, for women aged 60-64 with risk factors Bone density at age of 97.   Your major risk factors:   Family history of ______Depression______   Obesity_______   Diabetes__x_____    Hypertension______   Fall Risk______   Smoking Use______    Other___________  Recommendations for improvement:   Diet_x____   Tobacco Cessation_____   Weight Management____   Exercise__x__   Other_____    8. List of risk factors and interventions:   Patient was given specific education handout as below related to fire safety, driving safety, suicide risk, osteoporosis prevention, and falls prevention. Patient was able to ask questions about those risks and what the patient can do to avoid or prevent.   ===========================================================  Patient Education Handout  Fire Safety  The Facts:  ??? Cooking is the primary cause of home fires.  ??? Smoking is the leading cause of fire-related deaths.  ??? 80% of [place] fire deaths occur in home fires.  ??? 50% of home fire deaths occur in homes without smoke alarms.  ??? Most home fires occur during winter months.  ??? Alcohol contributes to an estimated 40% of home related fire deaths.  Who is at greatest risk?  ??? Children under the age of 29.  ??? Adults 65 and older.  ??? African Americans and Native Americans.  ??? Persons living in rural areas. ??? Increase lower body strength and balance through regular physical activity and exercise.  ??? Review all of your medications with your provider regularly to see if any can be eliminated or the dose reduced.  ??? Have your vision checked regularly.  ??? Remove tripping hazards in your home such as clutter in the hallways and on the stairs.  ??? Remove throw rugs.  ??? Use non-slip mats in the bathtub and on shower floors.  ??? Have grab bars next to the toilet and in the tub or shower.  ??? Have handrails on both sides of the stairway  ??? Be sure that the lighting is adequate throughout your home.  ??? Do not drink alcohol to excess.  ??? If you require the assistance of a cane or walker, use it all the time.  ============================================================    9. Health advice and referrals:   It was discussed with patient  preventive counseling services or programs aimed at reducing identified risk factors and improving self-management, or community-based lifestyle interventions to reduce health risks and promote self-management and wellness, including weight management, physical activity, smoking cessation, fall prevention, and nutrition.  Referrals:   Opthalmology    10. VOLUNTARY: Advance directive :   It was discussed with the patient the importance of advance directive and how the patient can prepare an advance directive in the case where an injury or illness causes the individual to be unable to make health care decisions. The patient was  also given the resource and the referral option to our clinic social worker to provide support and guidance on how to get an advance directive.     Staff conducting initial intake: Dierdre Harness, MD    Physician:   I reviewed and approved orders and components above of annual wellness visit and the personalized prevention plan services for this patient.

## 2018-07-16 ENCOUNTER — Encounter: Admit: 2018-07-16 | Discharge: 2018-07-16 | Payer: MEDICARE

## 2018-07-22 ENCOUNTER — Encounter: Admit: 2018-07-22 | Discharge: 2018-07-22 | Payer: MEDICARE

## 2018-07-31 ENCOUNTER — Encounter: Admit: 2018-07-31 | Discharge: 2018-07-31 | Payer: MEDICARE

## 2018-07-31 NOTE — Telephone Encounter
Patient is back up for a living kidney donor.  Message left asked for call back.    Spoke with patient about living donor backup offer. Organ is standard risk.  Patient denies any medical changes or insurance changes. She is agreeable to move forward with xm.  No surgery date set at this time.

## 2018-08-05 ENCOUNTER — Ambulatory Visit: Admit: 2018-08-05 | Discharge: 2018-08-06 | Payer: MEDICARE

## 2018-08-12 ENCOUNTER — Encounter: Admit: 2018-08-12 | Discharge: 2018-08-12

## 2018-08-13 ENCOUNTER — Encounter: Admit: 2018-08-13 | Discharge: 2018-08-13

## 2018-08-13 NOTE — Telephone Encounter
Patient no longer back up for LD transplant. She voiced understanding. Positive crossmatch in chain.

## 2018-08-16 ENCOUNTER — Encounter: Admit: 2018-08-16 | Discharge: 2018-08-16

## 2018-08-16 NOTE — Telephone Encounter
Called patient regarding [employer] donor offer [id number], Match ID [id number].  Reviewed organ offer.  Pt declined kidney due to [facility].

## 2018-08-21 ENCOUNTER — Encounter: Admit: 2018-08-21 | Discharge: 2018-08-21

## 2018-09-04 ENCOUNTER — Encounter: Admit: 2018-09-04 | Discharge: 2018-09-04

## 2018-09-05 NOTE — Telephone Encounter
Received organ offer from [employer] number [id number] / Match Run number [phone].  Reviewed donor summary and match run list.  Organ donor has KDPI of 61%, patient notified.  Organ offer is increased risk.  Reviewed recipients informed consent visit form.  Aerith consented to [facility] less thant 85%.  Kember is back up status.  Reviewed recipient's history.  Notified Talon of organ offer, organ type and current status on list.  Maymie declined offer. [facility] and Dr. Caryl Comes notified.

## 2018-09-08 ENCOUNTER — Encounter: Admit: 2018-09-08 | Discharge: 2018-09-08

## 2018-09-08 NOTE — Telephone Encounter
Received organ offer from UNOS number [id number] / Match Run number [phone].  Reviewed donor summary and match run list.  Organ donor has KDPI of 72%, patient notified.  Organ offer is donation after circulatory death.    Patient refused organ offer due to donor quality. NC notified patient there is a good chance she would become primary for this offer, patient firmly declined once she heard the [facility].     NC notified Drs Arie Sabina and [place].     Gaylyn Rong, RN  Pager [phone]

## 2018-09-10 ENCOUNTER — Encounter: Admit: 2018-09-10 | Discharge: 2018-09-10

## 2018-09-10 ENCOUNTER — Ambulatory Visit: Admit: 2018-09-10 | Discharge: 2018-09-11

## 2018-09-10 DIAGNOSIS — E785 Hyperlipidemia, unspecified: Secondary | ICD-10-CM

## 2018-09-10 DIAGNOSIS — I749 Embolism and thrombosis of unspecified artery: Secondary | ICD-10-CM

## 2018-09-10 DIAGNOSIS — I10 Essential (primary) hypertension: Secondary | ICD-10-CM

## 2018-09-10 DIAGNOSIS — N186 End stage renal disease: Secondary | ICD-10-CM

## 2018-09-10 DIAGNOSIS — Z992 Dependence on renal dialysis: Secondary | ICD-10-CM

## 2018-09-10 DIAGNOSIS — F4323 Adjustment disorder with mixed anxiety and depressed mood: Secondary | ICD-10-CM

## 2018-09-10 DIAGNOSIS — Q613 Polycystic kidney, unspecified: Secondary | ICD-10-CM

## 2018-09-10 DIAGNOSIS — Z124 Encounter for screening for malignant neoplasm of cervix: Secondary | ICD-10-CM

## 2018-09-10 DIAGNOSIS — D649 Anemia, unspecified: Secondary | ICD-10-CM

## 2018-09-10 DIAGNOSIS — Z91013 Allergy to seafood: Secondary | ICD-10-CM

## 2018-09-10 DIAGNOSIS — Z9889 Other specified postprocedural states: Secondary | ICD-10-CM

## 2018-09-11 DIAGNOSIS — Z1211 Encounter for screening for malignant neoplasm of colon: Secondary | ICD-10-CM

## 2018-09-14 ENCOUNTER — Encounter: Admit: 2018-09-14 | Discharge: 2018-09-14

## 2018-09-14 DIAGNOSIS — I749 Embolism and thrombosis of unspecified artery: Secondary | ICD-10-CM

## 2018-09-14 DIAGNOSIS — I10 Essential (primary) hypertension: Secondary | ICD-10-CM

## 2018-09-14 DIAGNOSIS — D649 Anemia, unspecified: Secondary | ICD-10-CM

## 2018-09-14 DIAGNOSIS — Z992 Dependence on renal dialysis: Secondary | ICD-10-CM

## 2018-09-14 DIAGNOSIS — Q613 Polycystic kidney, unspecified: Secondary | ICD-10-CM

## 2018-09-14 DIAGNOSIS — N186 End stage renal disease: Secondary | ICD-10-CM

## 2018-09-14 DIAGNOSIS — Z9889 Other specified postprocedural states: Secondary | ICD-10-CM

## 2018-09-14 DIAGNOSIS — Z91013 Allergy to seafood: Secondary | ICD-10-CM

## 2018-09-14 DIAGNOSIS — E785 Hyperlipidemia, unspecified: Secondary | ICD-10-CM

## 2018-09-15 NOTE — Progress Notes
FYI- Result addressed.  No further action necessary.  Patient contacted through MyChart.

## 2018-10-05 ENCOUNTER — Ambulatory Visit: Admit: 2018-10-05 | Discharge: 2018-10-05

## 2018-10-05 DIAGNOSIS — Z1211 Encounter for screening for malignant neoplasm of colon: Secondary | ICD-10-CM

## 2018-10-27 ENCOUNTER — Encounter: Admit: 2018-10-27 | Discharge: 2018-10-27 | Payer: MEDICARE

## 2018-10-27 NOTE — Telephone Encounter
TFA reverified active coverage for financial waitlist review for Medicare ABD/ [employer] Medicare Supplement . No changes

## 2018-11-02 ENCOUNTER — Encounter: Admit: 2018-11-02 | Discharge: 2018-11-02

## 2018-11-02 DIAGNOSIS — Z1159 Encounter for screening for other viral diseases: Secondary | ICD-10-CM

## 2018-11-03 ENCOUNTER — Encounter: Admit: 2018-11-03 | Discharge: 2018-11-03

## 2018-11-03 MED ORDER — PEG-ELECTROLYTE SOLN 420 GRAM PO SOLR
0 refills | Status: DC
Start: 2018-11-03 — End: 2018-11-17

## 2018-11-03 NOTE — Patient Education
Called pt and reviewed instructions for her Colonoscopy and she verbalized understanding. Instructions sent as requested to her My Chart. Covid nasal swab ordered and scheduled.  SPLIT DOSE NULYTELY/GOLYTELY PREP (Recommended)  PATIENT PREPARATION INSTRUCTIONS??? COLONOSCOPY      Instructions: Fill your prescription at least (3) days before your scheduled procedure time.                 1 Week Prior:   1. Stop taking iron supplements (including multivitamins containing iron).  2. Do not eat any foods containing nuts, seeds, or kernels (for example: sunflower seeds or popcorn).  3. Discuss diabetic medications and insulin with the prescribing physician.    5 Days Prior:  1. Check with your prescribing physician for instructions about stopping your blood thinner. Examples of blood thinners are Aleve, Aspirin, Coumadin, Eliquis, Ibuprofen, Naproxen, Plavix, and Xarelto.  2. Do not give yourself a Lovenox injection the morning of the test. Lovenox injections may be taken as usual through the day before the test.    4 Days Prior:  1. Stop taking Metamucil, Perdiem, Citrucel, or any other bulk laxatives. Miralax is okay.     Day Prior:   1. Beginning in the morning, start a clear liquid diet. Drink a generous amount of clear liquids throughout the day, no alcohol. If you are on a fluid restriction, drink the recommended amount of clear liquids allowed by your physician. No solid or creamed/pureed foods.        Clear Liquid Diet (avoid all items with RED, PURPLE, or ORANGE coloring)  Water    Apple or White Grape Juice   Coffee or tea without cream   Tea    White Cranberry Juice   Chicken Bouillon or Broth (no noodles)  Soda Pop(all pop Is OK)  Green or Yellow Popsicles  Beef Bouillon or Broth (no noodles)    2. At 11:00 A.M. fill the Nulytely/Golytely jug to the ?ill??? line with lukewarm drinking water. Cap the jug and shake to dissolve the powder.  Place the jug in the refrigerator. 3. At 5:00 P.M. drink one 8-ounce glass of Nulytely/Golytely and repeat every (10-15) minutes until you have finished the first (???) gallon of Nulytely/Golytely (3) liters. If you become nauseated hold off on drinking for (30) minutes or so, then resume.    Day of Test:  1. Continue clear liquid diet.  2. Five hours before your scheduled procedure time drink the remaining Nulytely/Golytely 8-ounces at a time. Repeat every (10-15) minutes until you have finished.   3. You may drink clear liquids up until (4) hours before your scheduled procedure time. After this you should have nothing by mouth. This includes GUM or CANDY.   a. Chewing tobacco must be stopped (6) hours before your scheduled procedure.   b. If you have an early morning test, take ONLY your essential morning medications (heart, blood pressure, seizure, etc.) with a small sip of water.  4. You will be sedated for the procedure.  A responsible adult must drive you home (no Benedetto Goad, taxis or buses are allowed). If you do not have a driver we will be unable to do the test.  5. You will be here for (3-4) hours from arrival time.   6. You will not be able to return to work the same day if you have received sedation.   7. Please bring a list of your current medications and the dosages with you.

## 2018-11-04 ENCOUNTER — Encounter: Admit: 2018-11-04 | Discharge: 2018-11-04

## 2018-11-05 ENCOUNTER — Encounter: Admit: 2018-11-05 | Discharge: 2018-11-05

## 2018-11-06 NOTE — Telephone Encounter
Received organ offer from UNOS number [id number] / Match Run number [id number].  Reviewed donor summary and match run list.  Organ donor has KDPI of 73%, patient notified.  Organ offer is Standard.  Reviewed recipients informed consent visit form.  Diana Cooley consented to [facility] less thant 85%.  Diana Cooley is primary.  Reviewed recipient's history.  Notified Diana Cooley of organ offer, organ type and current status on list.  Diana Cooley declined this offer d/t the KDPI score. [facility] and nephrologist on-call notified.

## 2018-11-11 ENCOUNTER — Encounter: Admit: 2018-11-11 | Discharge: 2018-11-11

## 2018-11-11 DIAGNOSIS — Z1159 Encounter for screening for other viral diseases: Secondary | ICD-10-CM

## 2018-11-11 DIAGNOSIS — Q612 Polycystic kidney, adult type: Principal | ICD-10-CM

## 2018-11-11 DIAGNOSIS — Z01818 Encounter for other preprocedural examination: Secondary | ICD-10-CM

## 2018-11-11 NOTE — Telephone Encounter
Pt now primary for organ offer UNOS # [id number] match run [phone]. Pt notified and aware. Donor OR time set for 1800. Crossmatch pending.

## 2018-11-11 NOTE — Progress Notes
Patient arrived to [place] clinic for COVID-19 testing 11/11/18 1410. Patient identity confirmed via photo I.D. Nasopharyngeal procedure explained to the patient.   Nasopharyngeal swab completed left  Patient education provided given and instructed patient self isolate until contacted w/ results and further instructions. CDC handout on COVID-19 given to patient.   [email or web address]    Swab collected by Raynelle Dick, CNA.    Date symptoms began/reason for testing: preop

## 2018-11-12 ENCOUNTER — Encounter: Admit: 2018-11-12 | Discharge: 2018-11-12

## 2018-11-12 ENCOUNTER — Encounter: Admit: 2018-11-12 | Discharge: 2018-11-13

## 2018-11-12 DIAGNOSIS — Q612 Polycystic kidney, adult type: Principal | ICD-10-CM

## 2018-11-12 DIAGNOSIS — I10 Essential (primary) hypertension: Secondary | ICD-10-CM

## 2018-11-12 DIAGNOSIS — D649 Anemia, unspecified: Secondary | ICD-10-CM

## 2018-11-12 DIAGNOSIS — E785 Hyperlipidemia, unspecified: Secondary | ICD-10-CM

## 2018-11-12 DIAGNOSIS — Q613 Polycystic kidney, unspecified: Secondary | ICD-10-CM

## 2018-11-12 DIAGNOSIS — D899 Disorder involving the immune mechanism, unspecified: Secondary | ICD-10-CM

## 2018-11-12 DIAGNOSIS — Z9889 Other specified postprocedural states: Secondary | ICD-10-CM

## 2018-11-12 DIAGNOSIS — N186 End stage renal disease: Secondary | ICD-10-CM

## 2018-11-12 DIAGNOSIS — Z940 Kidney transplant status: Secondary | ICD-10-CM

## 2018-11-12 DIAGNOSIS — Z992 Dependence on renal dialysis: Secondary | ICD-10-CM

## 2018-11-12 DIAGNOSIS — I749 Embolism and thrombosis of unspecified artery: Secondary | ICD-10-CM

## 2018-11-12 DIAGNOSIS — Z91013 Allergy to seafood: Secondary | ICD-10-CM

## 2018-11-12 LAB — COVID-19 (SARS-COV-2) PCR

## 2018-11-12 MED ORDER — VALGANCICLOVIR 450 MG PO TAB
450 mg | Freq: Every day | ORAL | 0 refills | Status: DC
Start: 2018-11-12 — End: 2018-11-14
  Administered 2018-11-14: 14:00:00 450 mg via ORAL

## 2018-11-12 MED ORDER — CEPHALEXIN 500 MG PO CAP
500 mg | Freq: Two times a day (BID) | ORAL | 0 refills | Status: DC
Start: 2018-11-12 — End: 2018-11-17
  Administered 2018-11-15 – 2018-11-17 (×6): 500 mg via ORAL

## 2018-11-12 MED ORDER — BELLADONNA ALKALOIDS-OPIUM 16.2-60 MG RE SUPP
1 | RECTAL | 0 refills | Status: DC | PRN
Start: 2018-11-12 — End: 2018-11-17

## 2018-11-12 MED ORDER — NYSTATIN 100,000 UNIT/ML PO SUSP
500000 [IU] | Freq: Four times a day (QID) | ORAL | 0 refills | Status: DC
Start: 2018-11-12 — End: 2018-11-17
  Administered 2018-11-12 – 2018-11-17 (×21): 500000 [IU] via ORAL

## 2018-11-12 MED ORDER — PATCH DOCUMENTATION - LIDOCAINE 5%
Freq: Two times a day (BID) | TRANSDERMAL | 0 refills | Status: DC
Start: 2018-11-12 — End: 2018-11-17

## 2018-11-12 MED ORDER — FENTANYL CITRATE (PF) 50 MCG/ML IJ SOLN
25 ug | INTRAVENOUS | 0 refills | Status: DC | PRN
Start: 2018-11-12 — End: 2018-11-12

## 2018-11-12 MED ORDER — ACETAMINOPHEN 500 MG PO TAB
1000 mg | Freq: Once | ORAL | 0 refills | Status: CP
Start: 2018-11-12 — End: ?
  Administered 2018-11-12: 17:00:00 1000 mg via ORAL

## 2018-11-12 MED ORDER — BISACODYL 10 MG RE SUPP
10 mg | Freq: Every day | RECTAL | 0 refills | Status: DC | PRN
Start: 2018-11-12 — End: 2018-11-17

## 2018-11-12 MED ORDER — BUPIVACAINE-DEXTROSE-WATER(PF) 0.75 % (7.5 MG/ML) IJ SOLN
INTRATHECAL | 0 refills | Status: CP
Start: 2018-11-12 — End: ?
  Administered 2018-11-12: 13:00:00 1.8 mL via INTRATHECAL

## 2018-11-12 MED ORDER — HYDROMORPHONE (PF) 2 MG/ML IJ SYRG
.5-1 mg | INTRAVENOUS | 0 refills | Status: DC | PRN
Start: 2018-11-12 — End: 2018-11-12
  Administered 2018-11-12 (×2): 0.5 mg via INTRAVENOUS

## 2018-11-12 MED ORDER — METHYLPREDNISOLONE 500 MG IVPB
500 mg | Freq: Once | INTRAVENOUS | 0 refills | Status: CP
Start: 2018-11-12 — End: ?
  Administered 2018-11-12 (×2): 500 mg via INTRAVENOUS

## 2018-11-12 MED ORDER — IMS MIXTURE TEMPLATE
30 mg | Freq: Every day | ORAL | 0 refills | Status: CP
Start: 2018-11-12 — End: ?
  Administered 2018-11-16 (×2): 30 mg via ORAL

## 2018-11-12 MED ORDER — SODIUM CHLORIDE 0.45% WITH SODIUM BICARB 50MEQ IV INF (OR)
0 refills | Status: DC
Start: 2018-11-12 — End: 2018-11-12
  Administered 2018-11-12: 13:00:00 via INTRAVENOUS

## 2018-11-12 MED ORDER — ACETAMINOPHEN 325 MG PO TAB
650 mg | ORAL | 0 refills | Status: DC | PRN
Start: 2018-11-12 — End: 2018-11-17
  Administered 2018-11-13 – 2018-11-16 (×3): 650 mg via ORAL

## 2018-11-12 MED ORDER — CEFAZOLIN INJ 1GM IVP
2 g | Freq: Once | INTRAVENOUS | 0 refills | Status: CP
Start: 2018-11-12 — End: ?
  Administered 2018-11-12: 13:00:00 2 g via INTRAVENOUS

## 2018-11-12 MED ORDER — MYCOPHENOLATE SODIUM 360 MG PO TBEC
720 mg | Freq: Two times a day (BID) | ORAL | 0 refills | Status: DC
Start: 2018-11-12 — End: 2018-11-16
  Administered 2018-11-13 – 2018-11-16 (×8): 720 mg via ORAL

## 2018-11-12 MED ORDER — TACROLIMUS 1 MG PO CAP
8 mg | ORAL_CAPSULE | Freq: Two times a day (BID) | ORAL | 11 refills | 30.00000 days | Status: DC
Start: 2018-11-12 — End: 2018-11-17
  Filled 2018-11-17: qty 480, 30d supply, fill #1

## 2018-11-12 MED ORDER — SODIUM CHLORIDE 0.9 % IV SOLP
1000 mL | INTRAVENOUS | 0 refills | Status: CP
Start: 2018-11-12 — End: ?
  Administered 2018-11-12: 16:00:00 1000 mL via INTRAVENOUS

## 2018-11-12 MED ORDER — HALOPERIDOL LACTATE 5 MG/ML IJ SOLN
1 mg | Freq: Once | INTRAVENOUS | 0 refills | Status: DC | PRN
Start: 2018-11-12 — End: 2018-11-12

## 2018-11-12 MED ORDER — OXYCODONE 5 MG PO TAB
5-10 mg | Freq: Once | ORAL | 0 refills | Status: DC | PRN
Start: 2018-11-12 — End: 2018-11-12

## 2018-11-12 MED ORDER — SODIUM CHLORIDE 0.9 % IV SOLP
500 mL | INTRAVENOUS | 0 refills | Status: CP
Start: 2018-11-12 — End: ?
  Administered 2018-11-12: 19:00:00 500 mL via INTRAVENOUS

## 2018-11-12 MED ORDER — PANTOPRAZOLE 40 MG IV SOLR
40 mg | Freq: Two times a day (BID) | INTRAVENOUS | 0 refills | Status: DC
Start: 2018-11-12 — End: 2018-11-13
  Administered 2018-11-13: 01:00:00 40 mg via INTRAVENOUS

## 2018-11-12 MED ORDER — SODIUM CHLORIDE 0.45% WITH SODIUM BICARB 10MEQ IV INFUSION
INTRAVENOUS | 0 refills | Status: AC
Start: 2018-11-12 — End: ?
  Administered 2018-11-12 (×2): 1000.000 mL via INTRAVENOUS

## 2018-11-12 MED ORDER — FUROSEMIDE 10 MG/ML IJ SOLN
0 refills | Status: DC
Start: 2018-11-12 — End: 2018-11-12
  Administered 2018-11-12 (×5): 20 mg via INTRAVENOUS

## 2018-11-12 MED ORDER — PREDNISONE 20 MG PO TAB
20 mg | Freq: Every day | ORAL | 0 refills | Status: DC
Start: 2018-11-12 — End: 2018-11-17
  Administered 2018-11-17: 14:00:00 20 mg via ORAL

## 2018-11-12 MED ORDER — LYMPHOCYTE IMMUNE GLOBULIN (RAB) IVPB (PERIPHERAL)
Freq: Once | INTRAVENOUS | 0 refills | Status: CP
Start: 2018-11-12 — End: ?
  Administered 2018-11-12 (×4): 88.6 mL/h via INTRAVENOUS

## 2018-11-12 MED ORDER — FENTANYL CITRATE (PF) 50 MCG/ML IJ SOLN
0 refills | Status: DC
Start: 2018-11-12 — End: 2018-11-12
  Administered 2018-11-12: 13:00:00 50 ug via INTRAVENOUS

## 2018-11-12 MED ORDER — VALGANCICLOVIR 450 MG PO TAB
900 mg | ORAL_TABLET | Freq: Every day | ORAL | 2 refills | Status: DC
Start: 2018-11-12 — End: 2018-11-17
  Filled 2018-11-17: qty 60, 30d supply, fill #1

## 2018-11-12 MED ORDER — PANTOPRAZOLE 40 MG IV SOLR
40 mg | Freq: Every day | INTRAVENOUS | 0 refills | Status: DC
Start: 2018-11-12 — End: 2018-11-12

## 2018-11-12 MED ORDER — HEPARIN, PORCINE (PF) 5,000 UNIT/0.5 ML IJ SYRG
5000 [IU] | SUBCUTANEOUS | 0 refills | Status: DC
Start: 2018-11-12 — End: 2018-11-17

## 2018-11-12 MED ORDER — POLYETHYLENE GLYCOL 3350 17 GRAM PO PWPK
1 | Freq: Every day | ORAL | 0 refills | Status: DC
Start: 2018-11-12 — End: 2018-11-17
  Administered 2018-11-13: 14:00:00 17 g via ORAL

## 2018-11-12 MED ORDER — PROCHLORPERAZINE EDISYLATE 5 MG/ML IJ SOLN
10 mg | INTRAVENOUS | 0 refills | Status: DC | PRN
Start: 2018-11-12 — End: 2018-11-17
  Administered 2018-11-12 – 2018-11-13 (×2): 10 mg via INTRAVENOUS

## 2018-11-12 MED ORDER — CEFAZOLIN INJ 500MG IVP
500 mg | Freq: Two times a day (BID) | INTRAVENOUS | 0 refills | Status: CP
Start: 2018-11-12 — End: ?
  Administered 2018-11-13 – 2018-11-14 (×4): 500 mg via INTRAVENOUS

## 2018-11-12 MED ORDER — PROMETHAZINE 25 MG/ML IJ SOLN
6.25 mg | INTRAVENOUS | 0 refills | Status: DC | PRN
Start: 2018-11-12 — End: 2018-11-12

## 2018-11-12 MED ORDER — LIDOCAINE 5 % TP PTMD
1 | Freq: Every day | TOPICAL | 0 refills | Status: DC
Start: 2018-11-12 — End: 2018-11-17
  Administered 2018-11-12 – 2018-11-17 (×6): 1 via TOPICAL

## 2018-11-12 MED ORDER — DIPHENHYDRAMINE HCL 50 MG/ML IJ SOLN
25 mg | Freq: Once | INTRAVENOUS | 0 refills | Status: DC | PRN
Start: 2018-11-12 — End: 2018-11-12

## 2018-11-12 MED ORDER — MIDAZOLAM 1 MG/ML IJ SOLN
INTRAVENOUS | 0 refills | Status: DC
Start: 2018-11-12 — End: 2018-11-12
  Administered 2018-11-12: 13:00:00 2 mg via INTRAVENOUS

## 2018-11-12 MED ORDER — IMS MIXTURE TEMPLATE
4.5 mg | Freq: Two times a day (BID) | ORAL | 0 refills | Status: DC
Start: 2018-11-12 — End: 2018-11-14
  Administered 2018-11-12 – 2018-11-14 (×8): 4.5 mg via ORAL

## 2018-11-12 MED ORDER — METHYLPREDNISOLONE SOD SUC(PF) 125 MG/2 ML IJ SOLR
120 mg | Freq: Every day | INTRAVENOUS | 0 refills | Status: CP
Start: 2018-11-12 — End: ?
  Administered 2018-11-13: 18:00:00 120 mg via INTRAVENOUS

## 2018-11-12 MED ORDER — SODIUM CHLORIDE 0.45% WITH SODIUM BICARB 10MEQ IV INFUSION
INTRAVENOUS | 0 refills | Status: DC
Start: 2018-11-12 — End: 2018-11-13
  Administered 2018-11-13 (×2): 1000.000 mL via INTRAVENOUS

## 2018-11-12 MED ORDER — MYCOPHENOLATE SODIUM 360 MG PO TBEC
720 mg | Freq: Once | ORAL | 0 refills | Status: CP
Start: 2018-11-12 — End: ?
  Administered 2018-11-12: 12:00:00 720 mg via ORAL

## 2018-11-12 MED ORDER — METHYLPREDNISOLONE SOD SUC(PF) 40 MG/ML IJ SOLR
40 mg | Freq: Every day | INTRAVENOUS | 0 refills | Status: CP
Start: 2018-11-12 — End: ?
  Administered 2018-11-15: 18:00:00 40 mg via INTRAVENOUS

## 2018-11-12 MED ORDER — KETAMINE 10 MG/ML IJ SOLN
0 refills | Status: DC
Start: 2018-11-12 — End: 2018-11-12
  Administered 2018-11-12: 15:00:00 10 mg via INTRAVENOUS

## 2018-11-12 MED ORDER — MORPHINE (PF) 1 MG/ML IJ SOLN
INTRATHECAL | 0 refills | Status: CP
Start: 2018-11-12 — End: ?
  Administered 2018-11-12: 13:00:00 200 ug via INTRATHECAL

## 2018-11-12 MED ORDER — HEPARIN 2500UNITS NS 250ML IRR SOLN (OR)
0 refills | Status: DC
Start: 2018-11-12 — End: 2018-11-12
  Administered 2018-11-12 (×2): 250 mL

## 2018-11-12 MED ORDER — HYDROMORPHONE (PF) 2 MG/ML IJ SYRG
1 mg | INTRAVENOUS | 0 refills | Status: DC | PRN
Start: 2018-11-12 — End: 2018-11-12

## 2018-11-12 MED ORDER — METHYLPREDNISOLONE SOD SUC(PF) 125 MG/2 ML IJ SOLR
80 mg | Freq: Every day | INTRAVENOUS | 0 refills | Status: CP
Start: 2018-11-12 — End: ?
  Administered 2018-11-14: 18:00:00 80 mg via INTRAVENOUS

## 2018-11-12 MED ORDER — MYCOPHENOLATE SODIUM 180 MG PO TBEC
720 mg | ORAL_TABLET | Freq: Two times a day (BID) | ORAL | 11 refills | Status: DC
Start: 2018-11-12 — End: 2018-11-17
  Filled 2018-11-17: qty 240, 30d supply, fill #1

## 2018-11-12 MED ORDER — OXYCODONE 5 MG PO TAB
5-10 mg | ORAL | 0 refills | Status: DC | PRN
Start: 2018-11-12 — End: 2018-11-13

## 2018-11-12 MED ORDER — SODIUM CHLORIDE 0.9 % IV SOLP
1000 mL | INTRAVENOUS | 0 refills | Status: DC
Start: 2018-11-12 — End: 2018-11-12
  Administered 2018-11-12: 12:00:00 1000 mL via INTRAVENOUS

## 2018-11-12 MED ORDER — SODIUM CHLORIDE 0.9 % IV SOLP
0 refills | Status: DC
Start: 2018-11-12 — End: 2018-11-12
  Administered 2018-11-12: 15:00:00 via INTRAVENOUS

## 2018-11-12 MED ORDER — SODIUM CHLORIDE 0.9 % IV SOLP
0 refills | Status: DC
Start: 2018-11-12 — End: 2018-11-12
  Administered 2018-11-12: 13:00:00 via INTRAVENOUS

## 2018-11-12 MED ORDER — SENNOSIDES-DOCUSATE SODIUM 8.6-50 MG PO TAB
2 | Freq: Two times a day (BID) | ORAL | 0 refills | Status: DC
Start: 2018-11-12 — End: 2018-11-17
  Administered 2018-11-13 – 2018-11-14 (×4): 2 via ORAL

## 2018-11-12 MED ORDER — VENLAFAXINE 37.5 MG PO TAB
37.5 mg | Freq: Every day | ORAL | 0 refills | Status: DC
Start: 2018-11-12 — End: 2018-11-17
  Administered 2018-11-13 – 2018-11-17 (×6): 37.5 mg via ORAL

## 2018-11-12 MED ORDER — PROPOFOL 10 MG/ML IV EMUL (INFUSION)(AM)(OR)
0 refills | Status: DC
Start: 2018-11-12 — End: 2018-11-12
  Administered 2018-11-12: 13:00:00 100 ug/kg/min via INTRAVENOUS

## 2018-11-12 MED ORDER — DOPAMINE IN 5 % DEXTROSE 400 MG/250 ML (1,600 MCG/ML) IV SOLN
1-5 ug/kg/min | INTRAVENOUS | 0 refills | Status: DC
Start: 2018-11-12 — End: 2018-11-13
  Administered 2018-11-12: 21:00:00 3 ug/kg/min via INTRAVENOUS

## 2018-11-12 MED ORDER — HYDROMORPHONE (PF) 2 MG/ML IJ SYRG
.5 mg | INTRAVENOUS | 0 refills | Status: DC | PRN
Start: 2018-11-12 — End: 2018-11-13
  Administered 2018-11-12: 18:00:00 0.5 mg via INTRAVENOUS

## 2018-11-12 MED ORDER — ENOXAPARIN 40 MG/0.4 ML SC SYRG
40 mg | Freq: Every day | SUBCUTANEOUS | 0 refills | Status: DC
Start: 2018-11-12 — End: 2018-11-12

## 2018-11-12 MED ORDER — MANNITOL 20 % 20 % IV SOLP
0 refills | Status: DC
Start: 2018-11-12 — End: 2018-11-12
  Administered 2018-11-12: 14:00:00 25 g via INTRAVENOUS

## 2018-11-12 MED ORDER — HEPARIN 2500UNITS NS 250ML IRR SOLN (OR)
Freq: Once | 0 refills | Status: DC
Start: 2018-11-12 — End: 2018-11-12

## 2018-11-12 MED ORDER — FENTANYL CITRATE (PF) 50 MCG/ML IJ SOLN
INTRATHECAL | 0 refills | Status: CP
Start: 2018-11-12 — End: ?
  Administered 2018-11-12: 13:00:00 10 ug via INTRATHECAL
  Administered 2018-11-12: 15:00:00 40 ug via INTRATHECAL

## 2018-11-12 NOTE — Progress Notes
Attempted to call patient to discuss negative COVID-19 test result; no answer, left VM directing patient to MyChart, as patient is active on MyChart. RN sent MyChart message notifying patient of result.    Elior Robinette, RN

## 2018-11-12 NOTE — Progress Notes
Transplant Phase    Patient is POD 0, still in PACU, last BM prior to admission, Foley in place, PT/OT consulted, patient's Pain is controlled with current analgesics.  Medication(s) being used:   narcotic analgesics including  IVP hydromorphone and fentanyl in PACU for pain control, diet NPO . Patient not ready for discharge.     Patient's medication costs are as follows:  Tacrolimus: $0  Mycophonolate: $0  Valganciclovir: $130.98 due to part D plan    Patient expressed no/concern regarding costs of medications.     0900 NC requested ureteral stent removal appt from outpatient urology.     1000 Outpatient transplant labs    1058 NC reached out to C. Stalder, PCS tech, to check on copays for patient.     1400 NC rounded on patient, both patient and spouse appear to be sleeping. Pillow and binder left in room.     Gaylyn Rong, RN  Pager [phone]

## 2018-11-12 NOTE — Progress Notes
Patient arrived to room # [id number]) via ambulation accompanied by family. Patient transferred to the bed without assistance. Bedside safety checks completed. Initial patient assessment completed. Refer to flowsheet for details.    Admission skin assessment completed with: Estill Bamberg, RN    Pressure injury present on arrival?: No    1. Head/Face/Neck: No  2. Trunk/Back: No  3. Upper Extremities: No  4. Lower Extremities: No  5. Pelvic/Coccyx: No  6. Assessed for device associated injury? Yes  7. Malnutrition Screening Tool (Nursing Nutrition Assessment) Completed? Yes    See Doc Flowsheet for additional wound details.     INTERVENTIONS: N/A

## 2018-11-12 NOTE — Consults
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  Donor: DCD, IRD: No, Age: 51s, female, Ht 183cm. Wt 105kg. Creatinine Initial/Terminal. 1.36/1.04  KDPI: 51%, cPRA:  0%  Match: VXM Negative, PXM Negative  CMV: D+/R+  Induction: thymoglobulin  Maintenance Immunosuppression: Triple drug therapy  Post OP: tbd ,CIT: tbd  Hr, WIT:  tbd  Min    TRANSPLANT SYNOPSIS#1:  - Jan/2015 complicated by DGF, CMV viremia, and failed in 2017     CC:   ESRD   S/P DDRT     HPI:  Diana Cooley is a 58 y.o. 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     In short, this is her second kidney transplant, she underwent first transplant in 2015.  Her posttransplant course was complicated by DGF without complete recovery, She also had CMV viremia in 2015, She had diarrhea that was associated with Myfortic and improved with dose reduction and ultimately cessation of this medication.  She remains on triple therapy with prednisone, tacrolimus, and azathioprine, her allograft failed in 2017 and she was started on hemodialysis   We saw her after transplant, she was somnolent because of pain mediations. No chest pain or sob, no nausea or vomiting. No fever or chills      REVIEW OF SYSTEMS: Comprehensive 14-point ROS reviewed  Positives noted in HPI otherwise negative.    Allergies:   Allergies   Allergen Reactions   ??? Zofran [Ondansetron Hcl (Pf)] HYPOTENSION   ??? Shellfish Containing Products HIVES and SHORTNESS OF BREATH     Pulmonary and loss of consciousness    ??? Levaquin [Levofloxacin] NAUSEA AND VOMITING and HYPOTENSION       Past Medical History:  Medical History:   Diagnosis Date   ??? Anemia    ??? 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 ESRD (end stage renal disease) (HCC) 07/03/2011 ??? Hemodialysis patient (HCC) 07/03/2011   ??? HTN (hypertension)    ??? Hyperlipemia    ??? Hyperlipidemia 06/27/2011   ??? Hypertension 06/27/2011   ??? Polycystic kidney disease 06/27/2011   ??? S/P dilatation and curettage    ??? Shellfish allergy 07/15/2011       Social History:  Social History     Socioeconomic History   ??? Marital status: Married     Spouse name: Not on file   ??? Number of children: 1   ??? Years of education: Not on file   ??? Highest education level: Not on file   Occupational History     Employer: HOMEMAKER   Tobacco Use   ??? Smoking status: Never Smoker   ??? Smokeless tobacco: Never Used   Substance and Sexual Activity   ??? Alcohol use: No   ??? Drug use: No   ??? Sexual activity: Not Currently   Other Topics Concern   ??? Not on file   Social History Narrative   ??? Not on file       Surgical History:  Surgical History:   Procedure Laterality Date   ??? DOPPLER ECHOCARDIOGRAPHY     ??? ELECTROCARDIOGRAM         Family History:  Family History   Problem Relation Age of Onset   ??? Diabetes Mother    ???  Depression Mother    ??? Depression Father    ??? Cancer-Breast Neg Hx    ??? Cancer-Colon Neg Hx    ??? Cancer-Ovarian Neg Hx    ??? Cancer-Uterine Neg Hx        Problem List:  Patient Active Problem List    Diagnosis Date Noted   ??? GERD (gastroesophageal reflux disease) 11/12/2018   ??? Vitamin D deficiency 11/12/2018   ??? Obesity 11/12/2018   ??? Insomnia 11/12/2018   ??? Immunosuppression (HCC) 11/12/2018   ??? 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 Acute tubular necrosis (HCC) 05/06/2013   ??? Nausea and vomiting 04/28/2013   ??? Kidney transplanted 03/11/2013   ??? S/P bilateral salpingo-oophorectomy 01/11/2013   ??? Ovarian cyst 12/10/2012   ??? Abnormal cardiovascular stress test 07/15/2011   ??? Shellfish allergy 07/15/2011   ??? HTN (hypertension) 07/15/2011   ??? Abnormal stress test 07/15/2011   ??? Pre-transplant evaluation for ESRD (end stage renal disease) 07/15/2011   ??? ESRD (end stage renal disease) (HCC) 07/03/2011 ??? Hemodialysis patient (HCC) 07/03/2011   ??? Polycystic kidney disease 06/27/2011   ??? Hypertension 06/27/2011   ??? Hyperlipidemia 06/27/2011       Current Medications:    Current Facility-Administered Medications:   ???  acetaminophen (TYLENOL) tablet 650 mg, 650 mg, Oral, Q4H PRN, Trilby Leaver, MD  ???  belladonna/opium (B&O) 16.2/60 mg rectal suppository 1 suppository, 1 suppository, Rectal, Q6H PRN, Trilby Leaver, MD  ???  bisacodyL (DULCOLAX) rectal suppository 10 mg, 10 mg, Rectal, QDAY PRN, Brokaw, Stephanie L, APRN-NP  ???  ceFAZolin (ANCEF) IVP 500 mg, 500 mg, Intravenous, Q12H* **FOLLOWED BY** [START ON 11/14/2018] cephalexin (KEFLEX) capsule 500 mg, 500 mg, Oral, Q12H*, Trilby Leaver, MD  ???  DOPamine 400 mg/D5W 250 mL infusion (std conc)(premade), 1-5 mcg/kg/min, Intravenous, TITRATE, Brokaw, Stephanie L, APRN-NP  ???  [START ON 11/13/2018] heparin (porcine) PF syringe 5,000 Units, 5,000 Units, Subcutaneous, Q8H, Nix, Quinn, MD  ???  HYDROmorphone injection (DILAUDID) 0.5 mg, 0.5 mg, Intravenous, Q6H PRN, Brokaw, Stephanie L, APRN-NP, 0.5 mg at 11/12/18 1328  ???  lidocaine (LIDODERM) 5 % topical patch 1 patch, 1 patch, Topical, QDAY, 1 patch at 11/12/18 1328 **AND** Verification of Patch Placement and Integrity - Lidocaine 5%, , Transdermal, BID, Brokaw, Stephanie L, APRN-NP  ???  [START ON 11/13/2018] methylPREDNISolone (SOLU-MEDROL PF) injection 120 mg, 120 mg, Intravenous, QDAY **FOLLOWED BY** [START ON 11/14/2018] methylPREDNISolone (SOLU-MEDROL PF) injection 80 mg, 80 mg, Intravenous, QDAY **FOLLOWED BY** [START ON 11/15/2018] methylPREDNISolone (SOLU-Medrol) injection 40 mg, 40 mg, Intravenous, QDAY **FOLLOWED BY** [START ON 11/16/2018] predniSONE (DELTASONE) tablet 30 mg, 30 mg, Oral, QDAY **FOLLOWED BY** [START ON 11/17/2018] predniSONE (DELTASONE) tablet 20 mg, 20 mg, Oral, QDAY, Trilby Leaver, MD  ???  mycophenolate DR (MYFORTIC) tablet 720 mg, 720 mg, Oral, BID, Trilby Leaver, MD ???  nystatin (MYCOSTATIN) oral suspension 500,000 Units, 500,000 Units, Swish & Swallow, QID, Trilby Leaver, MD, 500,000 Units at 11/12/18 1328  ???  oxyCODONE (ROXICODONE) tablet 5-10 mg, 5-10 mg, Oral, Q4H PRN, Trilby Leaver, MD  ???  pantoprazole (PROTONIX) injection 40 mg, 40 mg, Intravenous, ZOX(09-60), Brokaw, Stephanie L, APRN-NP  ???  [START ON 11/13/2018] polyethylene glycol 3350 (MIRALAX) packet 17 g, 1 packet, Oral, QDAY, Brokaw, Stephanie L, APRN-NP  ???  prochlorperazine (COMPAZINE) injection 10 mg, 10 mg, Intravenous, Q6H PRN, Trilby Leaver, MD, 10 mg at 11/12/18 1306  ???  senna/docusate (SENOKOT-S) tablet 2 tablet, 2 tablet, Oral, BID, Brokaw, Stephanie L, APRN-NP  ???  sodium chloride 0.45% (1/2NS) 1,000 mL with sodium bicarbonate 10 mEq IV infusion, , Intravenous, TITRATE, Last Rate: 100 mL/hr at 11/12/18 1541 **FOLLOWED BY** sodium chloride 0.45% (1/2NS) 1,000 mL with sodium bicarbonate 10 mEq IV infusion, , Intravenous, Continuous, Nawabi, Cicero Duck, MD  ???  tacrolimus (PROGRAF) capsule 4.5 mg, 4.5 mg, Oral, BID(6-18), Brokaw, Stephanie L, APRN-NP  ???  [START ON 11/14/2018] valGANciclovir (VALCYTE) tablet 450 mg, 450 mg, Oral, QDAY, Brokaw, Stephanie L, APRN-NP  ???  venlafaxine (EFFEXOR) tablet 37.5 mg, 37.5 mg, Oral, QDAY, Brokaw, Stephanie L, APRN-NP    Physical Exam:  BP 110/59 (BP Source: Arm, Left Upper)  - Pulse 88  - Temp 37 ???C (98.6 ???F)  - Ht 157.5 cm (62)  - Wt 95.3 kg (210 lb 1.6 oz)  - SpO2 95%  - BMI 38.43 kg/m???     Intake/Output Summary (Last 24 hours) at 11/12/2018 1552  Last data filed at 11/12/2018 1530  Gross per 24 hour   Intake 5712 ml   Output 602 ml   Net 5110 ml     General: In NAD; A&Ox3  Skin: Warm, dry, no signs of rash   Neck: Supple   CV: Regular, regular rate, normal S1/S2, no murmurs  Lungs: CTA bilaterally in posterior fields  : Grossly normal without rebound, guarding, masses, or bruits   Transplant: LLQ, No overlying bruit, tender, incision dressing in place - clean, dry, and intact, + Drain Ext: No edema  Neuro: No focal deficits   Psych: Affect appropriate  Dialysis Access: Rt [facility]   Laboratory studies:     CMP:  CMP Latest Ref Rng & Units 11/12/2018 11/12/2018 03/25/2018 05/07/2017 02/12/2016   NA 137 - 147 MMOL/L 137 140 142 138 142   K 3.5 - 5.1 MMOL/L 4.0 3.6 3.7 4.1 4.3   CL 98 - 110 MMOL/L 103 100 101 100 104   CO2 21 - 30 MMOL/L 25 28 30 27 29    GAP 3 - 12 9 12 11 11 9    BUN 7 - 25 MG/DL 16(X) 09(U) 04(V) 40(J) 45(H)   CR 0.4 - 1.00 MG/DL 8.11(B) 1.47(W) 2.95(A) 5.08(H) 4.09(H)   GLUX 70 - 100 MG/DL 213(Y) 96 96 865(H) 89   CA 8.5 - 10.6 MG/DL 7.3(L) 9.4 9.6 9.3 10.0   TP 6.0 - 8.0 G/DL - 6.3 7.1 7.4 6.9   ALB 3.5 - 5.0 G/DL - 3.8 4.4 4.5 4.1   ALKP 25 - 110 U/L - 45 76 84 68   ALT 7 - 56 U/L - 5(L) 4(L) 9 9   TBILI 0.3 - 1.2 MG/DL - 0.6 0.8 0.6 0.6   GFR >60 mL/min 9(L) 8(L) 9(L) 9(L) 11(L)   GFRAA >60 mL/min 11(L) 10(L) 11(L) 11(L) 14(L)     Hemoglobin A1C (%)   Date Value   11/12/2018 5.3   05/07/2017 5.4   02/12/2016 5.1     PTH Hormone (PG/ML)   Date Value   11/12/2018 62.9   03/25/2018 559.8 (H)     Lipase   Date Value Ref Range Status   04/28/2013 19 11 - 82 U/L Final   09/25/2012 27 11 - 82 U/L Final     Amylase   Date Value Ref Range Status   04/28/2013 39 24 - 100 U/L Final   09/25/2012 61 24 - 100 U/L Final     BK Virus Plasma Quant (no units)   Date Value   07/01/2013 Negative for BK Virus  06/03/2013 Negative for BK Virus   05/19/2013 Negative for BK Virus   04/28/2013 Negative for BK Virus     CMV DNA Quant PCR (no units)   Date Value   11/12/2013 Positive for CMV   10/05/2013 Positive for CMV   09/22/2013 Negative for CMV   09/14/2013 Negative for CMV   09/08/2013 Negative for CMV     No results found for: IUMLBLD  No results found for: COPIES  EBV DNA, Quant. (no units)   Date Value   04/28/2013 Negative for EBV       TACROLIMUS LEVEL:  Tacrolimus Immunoassay (NG/ML)   Date Value   02/12/2016 9.5   12/09/2014 7.8   01/17/2014 6.4   12/31/2013 7.9   11/12/2013 8.4   10/05/2013 7.9 09/22/2013 7.4   09/14/2013 11.1       CBC with Diff:  CBC with Diff Latest Ref Rng & Units 11/12/2018 11/12/2018   WBC 4.5 - 11.0 K/UL 3.2(L) 5.8   RBC 4.0 - 5.0 M/UL 3.00(L) 3.67(L)   HGB 12.0 - 15.0 GM/DL 1.6(X) 11.9(L)   HCT 36 - 45 % 27.8(L) 34.0(L)   MCV 80 - 100 FL 92.9 92.4   MCH 26 - 34 PG 32.7 32.4   MCHC 32.0 - 36.0 G/DL 09.6 04.5   RDW 11 - 15 % 14.4 14.2   PLT 150 - 400 K/UL 96(L) 154   MPV 7 - 11 FL 8.1 8.7   NEUT 41 - 77 % - 75   ANC 1.8 - 7.0 K/UL - 4.38   LYMA 24 - 44 % - 14(L)   ALYM 1.0 - 4.8 K/UL - 0.82(L)   MONA 4 - 12 % - 9   AMONO 0 - 0.80 K/UL - 0.50   EOSA 0 - 5 % - 1   AEOS 0 - 0.45 K/UL - 0.06   BASA 0 - 2 % - 1   ABAS 0 - 0.20 K/UL - 0.03     Lab Results   Component Value Date/Time    IRON 85 03/25/2013 07:41 AM    TIBC 249 (L) 03/25/2013 07:41 AM    PSAT 34 03/25/2013 07:41 AM    FERRITIN 263 (H) 03/25/2013 07:41 AM       Urinalysis:  Lab Results   Component Value Date/Time    UCOLOR YELLOW 11/12/2018 05:20 AM    TURBID CLEAR 11/12/2018 05:20 AM    USPGR 1.008 11/12/2018 05:20 AM    UPH 5.0 11/12/2018 05:20 AM    UPROTEIN NEG 11/12/2018 05:20 AM    UAGLU NEG 11/12/2018 05:20 AM    UKET NEG 11/12/2018 05:20 AM    UBILE NEG 11/12/2018 05:20 AM    UBLD NEG 11/12/2018 05:20 AM    UROB NORMAL 11/12/2018 05:20 AM     Protein/CR ratio (no units)   Date Value   07/01/2013 0.4       Imaging:  Results for orders placed during the hospital encounter of 05/07/17   CT /PELV WO CONTRAST    Impression 1. 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 Some of these cystic renal lesions demonstrate intermediate density, likely representing  intracystic hemorrhage. Evaluation for renal neoplasm is limited in the absence of IV contrast.  2. No abdominal/pelvic inflammatory mass, ascites, or bowel obstruction.  3. Development of a irregular soft tissue nodule in the inferior left breast. Recommend mammography for further evaluation.  Approved by Trey Sailors, M.D. on 05/07/2017 2:26 PM    By my electronic signature, I attest that I have personally reviewed the images for this examination and formulated the interpretations and opinions expressed in this report       Finalized by Leisa Lenz, M.D. on 05/07/2017 2:31 PM. Dictated by Trey Sailors, M.D. on 05/07/2017 2:00 PM.       Results for orders placed during the hospital encounter of 11/12/18   CHEST 2 VIEWS    Impression Minor zones of atelectasis in the left costophrenic angle.    Otherwise stable chest radiograph demonstrating no acute cardiopulmonary abnormalities.       Finalized by Darel Hong, M.D. on 11/12/2018 7:16 AM. Dictated by Darel Hong, M.D. on 11/12/2018 7:15 AM.         Patient Active Problem List    Diagnosis Date Noted   ??? GERD (gastroesophageal reflux disease) 11/12/2018   ??? Vitamin D deficiency 11/12/2018   ??? Obesity 11/12/2018   ??? Insomnia 11/12/2018   ??? Immunosuppression (HCC) 11/12/2018   ??? 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 Acute tubular necrosis (HCC) 05/06/2013   ??? Nausea and vomiting 04/28/2013   ??? Kidney transplanted 03/11/2013   ??? S/P bilateral salpingo-oophorectomy 01/11/2013   ??? Ovarian cyst 12/10/2012   ??? Abnormal cardiovascular stress test 07/15/2011   ??? Shellfish allergy 07/15/2011   ??? HTN (hypertension) 07/15/2011   ??? Abnormal stress test 07/15/2011   ??? Pre-transplant evaluation for ESRD (end stage renal disease) 07/15/2011   ??? 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 Hemodialysis patient (HCC) 07/03/2011   ??? Polycystic kidney disease 06/27/2011   ??? Hypertension 06/27/2011   ??? Hyperlipidemia 06/27/2011       Cardiovascular Screening  Echocardiogram: Date jan/2020 - EF 50-55%, No WMA, No valvular disease which could compromise the transplanted renal blood flow  NM Stress Test: Date Jan/2020, Negative for inducible reversible ischemia and is considered low risk   Left Heart Catheterization: Date 2013: No angiographic evidence of significant obstructive coronary artery disease. Moderately depressed left anterior descending artery with an EF of 35 percent to 40 percent.    Cancer Screening  Colonoscopy: Date 2013 - 3 polyps tubular adenomas   Mammogram: Date sep/2019 - normal  PAP: Date apr/ 2019 - normal    Assessment and Plan:  Ms. 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 cPRA 0%, KDPI 51%    #) Immunosuppression - On maintenance Dual drug therapy  - Goal tacrolimus trough first 3 months after transplant is 12-17 ng/mL (MEIA) or 8-12 mcg/L (HPLC/LCMS), HPLC preferred metric.  - Currently taking Prograf 4.5 mg BID, first level Saturday morning   - MPA 720mg  BID and steroid pulse to wean to prednisone free    #) Deceased donor renal transplant: bl Cr tbd mg/dL, bl UPCR tbc gm/gm  - Renal graft function: patient is making good uop, and her bmp post transplant shoed slight improvement in scr, but it is too earl to appreciate her allograft function   - No indication for HD    #) ID Proph  - CMV Donor(+)/ Recipient(+) to treat with Valcyte for 3 months post transplant  - PJP Prophylax: To treat with Bactrim/Septra for 1 year post transplant  - Nystatin swish for 2 weeks post transplant    #) BPs: soft, keep off HTN meds     #) Diabetes  -  BGs have been under Fair control  - monitor     #) Anemia - Hgb average of 10, monitor     #) Volume/Electrolytes:   - K acceptable  - Ca unacceptable, low, monitor      Assalam, MD    [facility]  Service Pager: [phone]

## 2018-11-12 NOTE — Telephone Encounter
Please schedule patient for transplant ureteral stent removal on 12/24/18 or any day thereafter. Thank you.

## 2018-11-12 NOTE — Progress Notes
Labs obtained X 1 attempt with US guidance from [place]. Labs labeled, checked and sent by staff nurse. Patient tolerated well.

## 2018-11-12 NOTE — Progress Notes
RT Adult Assessment Note    NAME:Diana Cooley             MRN: [id number]             DOB:11-01-1960          AGE: 58 y.o.  ADMISSION DATE: 11/12/2018             DAYS ADMITTED: LOS: 0 days    RT Treatment Plan:            Additional Comments:  Impressions of the patient: NAD, on RA  Intervention(s)/outcome(s): criteria not met  Patient education that was completed: none  Recommendations to the care team: none    Vital Signs:  Pulse: 76  RR: 16 PER MINUTE  SpO2: 98 %  O2 Device:    Liter Flow:    O2%:    Breath Sounds:    Respiratory Effort: Non-Labored

## 2018-11-12 NOTE — Progress Notes
11/12/18 1330   Vitals   BP 113/60   Mean NBP (Calculated) 78 MM HG     Notified NP of patient's BP and urine output. NP to place orders.

## 2018-11-12 NOTE — Anesthesia Pre-Procedure Evaluation
Anesthesia Pre-Procedure Evaluation    Name: Diana Cooley      MRN: [id number]     DOB: Apr 27, 1960     Age: 58 y.o.     Sex: female   _________________________________________________________________________     Procedure Info:   Procedure Information     Date/Time:  11/12/18 0730    Procedure:  ALLOTRANSPLANTATION KIDNEY FROM NON LIVING DONOR WITHOUT RECIPIENT NEPHRECTOMY (N/A )    Location:  MAIN OR 60 / Main OR/Periop    Surgeon:  York Cerise, MD          Physical Assessment  Vital Signs (last filed in past 24 hours):  BP: 165/93 (09/03 0712)  Temp: 36.9 ???C (98.4 ???F) (09/03 9562)  Pulse: 73 (09/03 0712)  Respirations: 14 PER MINUTE (09/03 0712)  SpO2: 100 % (09/03 1308)      Patient History   Allergies   Allergen Reactions   ??? Zofran [Ondansetron Hcl (Pf)] HYPOTENSION   ??? Shellfish Containing Products HIVES and SHORTNESS OF BREATH     Pulmonary and loss of consciousness    ??? Levaquin [Levofloxacin] NAUSEA AND VOMITING and HYPOTENSION        Current Medications    Medication Directions   atorvastatin (LIPITOR) 20 mg tablet Take 20 mg by mouth at bedtime daily.   azaTHIOprine (IMURAN) 50 mg tablet Take one tablet by mouth daily.   calcitrioL (ROCALTROL) 0.25 mcg capsule Take 0.25 mcg by mouth daily.   carvediloL (COREG) 25 mg tablet Take 25 mg by mouth twice daily.   cholecalciferol (VITAMIN D-3) 1,000 units tablet Take 1,000 Units by mouth twice daily.   ferrous fumarate/vit Bcomp,C (SUPER B COMPLEX PO) Take  by mouth daily.   lanthanum(+) (FOSRENOL) 500 mg chew Chew 500 mg by mouth three times daily with meals.   pantoprazole DR (PROTONIX) 40 mg tablet Take 40 mg by mouth twice daily.   peg-electrolyte solution (NULYTELY) 420 gram oral solution Mix as directed on package. Drink (8oz) every 10 minutes until gone. Refrigerate once mixed.   predniSONE (DELTASONE) 5 mg tablet Take one tablet by mouth daily.   tacrolimus (PROGRAF) 0.5 mg capsule Take one capsule by mouth daily. Take 1.5mg  in the AM and 1mg  in the PM (with 1mg  capsules)   tacrolimus (PROGRAF) 1 mg capsule Take one capsule by mouth twice daily. Total dose 1.5mg  in AM and 1mg  in PM   venlafaxine (EFFEXOR) 37.5 mg tablet Take one tablet by mouth daily for 180 days.   zolpidem (AMBIEN) 5 mg tablet Take 5 mg by mouth at bedtime as needed for Sleep.         Review of Systems/Medical History      Patient summary reviewed  Nursing notes reviewed  Pertinent labs reviewed    PONV Screening: Female gender and Non-smoker  No history of anesthetic complications  No family history of anesthetic complications          Cardiovascular         Exercise tolerance: >4 METS        Hypertension, well controlled      Hyperlipidemia      GI/Hepatic/Renal          Renal disease: ESRD and dialysis           Date of last dialysis: yesterday      ESRD secondary to polycystic kidney disease.  Patient underwent prior renal transplant 2015 which was complicated by delayed graft function without complete recovery. She  has been on hemodialysis since 2017 via tunneled catheter.  She undergoes home hemodialysis 4 times per week.  She still makes 6 to 700 mL's of urine per day.         Physical Exam    Airway Findings      Mallampati: II      TM distance: >3 FB      Neck ROM: full      Mouth opening: good      Airway patency: adequate    Dental Findings:       Increased risk for dental injury; pt advised and poor dentition    Cardiovascular Findings:       Rhythm: regular      Rate: normal    Pulmonary Findings:    No wheezes or no stridor.    Abdominal Findings:       Obese    Neurological Findings:       Alert and oriented x 3    Normal mental status       Diagnostic Tests  Hematology:   Lab Results   Component Value Date    HGB 11.9 11/12/2018    HCT 34.0 11/12/2018    PLTCT 154 11/12/2018    WBC 5.8 11/12/2018    NEUT 75 11/12/2018    ANC 4.38 11/12/2018    LYMPH 4 07/01/2013    ALC 0.82 11/12/2018    MONA 9 11/12/2018    AMC 0.50 11/12/2018 EOSA 1 11/12/2018    ABC 0.03 11/12/2018    BASOPHILS 1 07/01/2013    MCV 92.4 11/12/2018    MCH 32.4 11/12/2018    MCHC 35.1 11/12/2018    MPV 8.7 11/12/2018    RDW 14.2 11/12/2018         General Chemistry:   Lab Results   Component Value Date    NA 140 11/12/2018    K 3.6 11/12/2018    CL 100 11/12/2018    CO2 28 11/12/2018    GAP 12 11/12/2018    BUN 38 11/12/2018    CR 5.46 11/12/2018    GLU 96 11/12/2018    GLU 105 04/18/2015    CA 9.4 11/12/2018    ALBUMIN 3.8 11/12/2018    MG 1.9 02/12/2016    TOTBILI 0.6 11/12/2018    PO4 4.0 03/25/2018      Coagulation:   Lab Results   Component Value Date    PT 10.9 03/29/2013    PTT 24.5 11/12/2018    INR 1.1 11/12/2018         Anesthesia Plan    ASA score: 3   Plan: regional      Informed Consent  Anesthetic plan and risks discussed with patient.  Use of blood products discussed with patient

## 2018-11-12 NOTE — Anesthesia Post-Procedure Evaluation
Post-Anesthesia Evaluation    Name: Diana Cooley      MRN: [id number]     DOB: October 09, 1960     Age: 58 y.o.     Sex: female   __________________________________________________________________________     Procedure Information     Anesthesia Start Date/Time:  11/12/18 0758    Procedure:  ALLOTRANSPLANTATION KIDNEY FROM NON LIVING DONOR WITHOUT RECIPIENT NEPHRECTOMY (N/A Abdomen)    Location:  MAIN OR 60 / Main OR/Periop    Surgeon:  Leilani Able, MD          Post-Anesthesia Vitals  BP: 130/82 (09/03 1245)  Temp: 36 C (96.8 F) (09/03 1230)  Pulse: 89 (09/03 1230)  Respirations: 18 PER MINUTE (09/03 1230)  SpO2: 93 % (09/03 1230)  SpO2 Pulse: 89 (09/03 1230)   Vitals Value Taken Time   BP 130/82 11/12/2018 12:45 PM   Temp 36 C (96.8 F) 11/12/2018 12:30 PM   Pulse 89 11/12/2018 12:30 PM   Respirations 18 PER MINUTE 11/12/2018 12:30 PM   SpO2 93 % 11/12/2018 12:30 PM         Post Anesthesia Evaluation Note    Evaluation location: Pre/Post  Patient participation: recovered; patient participated in evaluation  Level of consciousness: sleepy but conscious    Pain score: 2  Pain management: adequate    Hydration: normovolemia  Temperature: 36.0C - 38.4C  Airway patency: adequate    Perioperative Events       Post-op nausea and vomiting: no PONV    Postoperative Status  Cardiovascular status: hemodynamically stable  Respiratory status: spontaneous ventilation        Perioperative Events  Perioperative Event: No  Emergency Case Activation: No

## 2018-11-12 NOTE — Anesthesia Procedure Notes
Anesthesia Procedure: Spinal Block    SPINAL BLOCK    Date/Time: 11/12/2018 8:15 AM    Patient location: OR  Start time: 11/12/2018 8:15 AM  Reason for block: primary anesthetic and landmark technique    Preprocedure checklist performed: 2 patient identifiers, risks & benefits discussed, patient evaluated, timeout performed, consent obtained, patient being monitored, existing labs reviewed, no anticoagulant within risk period and sterile drape    Sterile technique:  - Proper hand washing  - Cap, mask  - Sterile gloves  - Skin prep for antisepsis      Spinal Block Procedure   Patient position: sitting  Prep: ChloraPrep    Monitoring: BP, EKG and continuous pulse ox  Approach: midline  Location: L4-5  Injection technique: single-shot    Needle/catheter:      Needle type: pencil-tip     Needle gauge: 25 G,      Needle length: 3.5 in    Procedure Medications  Intrathecal Dose: bupivacaine PF 0.75% /dextrose 8.25% (MARCAINE) injection, 1.8 mL  Adjuvant Medications: morphine PF   (ASTRAMORPH-PF) 1 mg/mL, 200 mcg  fentaNYL  PF (SUBLIMAZE) injection, 10 mcg    Procedure Outcome  Sensory level: T4  Events: cerebral spinal fluid aspirated  Observations: adequate block and patient tolerated the procedure well with no immediate complications    Refer to nursing documentation for vitals/monitoring data    Performed by: Sanjuan Dame, MD  Authorized by: Sanjuan Dame, MD

## 2018-11-13 ENCOUNTER — Encounter: Admit: 2018-11-13 | Discharge: 2018-11-13

## 2018-11-13 DIAGNOSIS — I749 Embolism and thrombosis of unspecified artery: Secondary | ICD-10-CM

## 2018-11-13 DIAGNOSIS — Z992 Dependence on renal dialysis: Secondary | ICD-10-CM

## 2018-11-13 DIAGNOSIS — E785 Hyperlipidemia, unspecified: Secondary | ICD-10-CM

## 2018-11-13 DIAGNOSIS — N186 End stage renal disease: Secondary | ICD-10-CM

## 2018-11-13 DIAGNOSIS — Z9889 Other specified postprocedural states: Secondary | ICD-10-CM

## 2018-11-13 DIAGNOSIS — D649 Anemia, unspecified: Secondary | ICD-10-CM

## 2018-11-13 DIAGNOSIS — Q613 Polycystic kidney, unspecified: Secondary | ICD-10-CM

## 2018-11-13 DIAGNOSIS — D899 Disorder involving the immune mechanism, unspecified: Secondary | ICD-10-CM

## 2018-11-13 DIAGNOSIS — Z940 Kidney transplant status: Secondary | ICD-10-CM

## 2018-11-13 DIAGNOSIS — I10 Essential (primary) hypertension: Secondary | ICD-10-CM

## 2018-11-13 DIAGNOSIS — Z91013 Allergy to seafood: Secondary | ICD-10-CM

## 2018-11-13 MED ORDER — SODIUM CHLORIDE 0.9 % IV SOLP
500 mL | INTRAVENOUS | 0 refills | Status: CP
Start: 2018-11-13 — End: ?
  Administered 2018-11-13: 20:00:00 500 mL via INTRAVENOUS

## 2018-11-13 MED ORDER — LYMPHOCYTE IMMUNE GLOBULIN (RAB) IVPB (PERIPHERAL)
Freq: Once | INTRAVENOUS | 0 refills | Status: CP
Start: 2018-11-13 — End: ?
  Administered 2018-11-13 (×4): 531.400 mL via INTRAVENOUS

## 2018-11-13 MED ORDER — ACETAMINOPHEN 325 MG PO TAB
650 mg | Freq: Once | ORAL | 0 refills | Status: CP
Start: 2018-11-13 — End: ?
  Administered 2018-11-13: 18:00:00 650 mg via ORAL

## 2018-11-13 MED ORDER — MAGNESIUM SULFATE IN D5W 1 GRAM/100 ML IV PGBK
1 g | INTRAVENOUS | 0 refills | Status: CP
Start: 2018-11-13 — End: ?
  Administered 2018-11-13 (×2): 1 g via INTRAVENOUS

## 2018-11-13 MED ORDER — TRAMADOL 50 MG PO TAB
50 mg | ORAL | 0 refills | Status: DC | PRN
Start: 2018-11-13 — End: 2018-11-17

## 2018-11-13 MED ORDER — PANTOPRAZOLE 40 MG PO TBEC
40 mg | Freq: Every day | ORAL | 0 refills | Status: DC
Start: 2018-11-13 — End: 2018-11-17
  Administered 2018-11-14 – 2018-11-17 (×4): 40 mg via ORAL

## 2018-11-13 MED ORDER — DIPHENHYDRAMINE HCL 25 MG PO CAP
25 mg | Freq: Once | ORAL | 0 refills | Status: CP
Start: 2018-11-13 — End: ?
  Administered 2018-11-13: 18:00:00 25 mg via ORAL

## 2018-11-13 MED ORDER — SENNOSIDES-DOCUSATE SODIUM 8.6-50 MG PO TAB
2 | ORAL_TABLET | Freq: Two times a day (BID) | ORAL | 1 refills | Status: CN
Start: 2018-11-13 — End: ?

## 2018-11-13 NOTE — Progress Notes
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 Ureteral stent placed.  Induction: Thymo 150mg , Solumedrol 500mg . Myfortic 720mg  BID  Immunosuppression: Thymo 150mg  9/4   Steroid taper   Myfortic 720mg  BID   Prograf 4.5mg  BID with goal of 10-15. Follow levels  JP: 366mL/24hrs  Creatinine: 5.21  UOP: 1075mL/24hrs (Foley in place)    Ppx  PJP: Bactrim to start on discharge  CMV: D+/R+   Valcyte to start on discharge  Stent: Keflex while inpatient  Fungal: Nystatin QID  DVT: Heparin ppx    FEN  NPO  IVF: 0.45+68meq sodium bicarb @100ml /hr  Bowel regimen not yet started      Plan:  Immunosuppression: Continue steroid taper and Myfortic 720mg  BID. No change to Prograf. Thymo 150mg  today.  *DDRT: Maintain foley, JP drain. Bowel regimen, oral pain regimen, lidoderm patch.   *CV: D/C dopamine.   *FEN: SLIV. Bolus NS for +orthostatic hypotension.   *Dispo: Continue floor care, PT/OT. Discharge planning.       Will discuss with Dr. Caprice Kluver                   Subjective: Diana Cooley is a 58 y.o. patient. Limited subjective exam, patient tearful and feels poorly when trying to work with PT. No reports of chest pain, N/V, SOA, uncontrolled pain.     Objective:   Vitals:    11/13/18 0625 11/13/18 0721 11/13/18 0901 11/13/18 1321   BP: (!) 140/75 126/73 130/75 127/72   BP Source: Arm, Left Upper  Arm, Right Upper Arm, Right Upper   Pulse: 84  86 77 Temp: 37.1 ???C (98.8 ???F)  36.7 ???C (98.1 ???F) 36.6 ???C (97.8 ???F)   SpO2: 95%  96% 95%   Weight: 96.6 kg (212 lb 15.4 oz)      Height: 157.5 cm (62.01)          Physical Exam  GEN: NAD.  HEENT: Nonicteric bilaterally.  NEURO: AOx3. PERRL. MAE. Pain not currently controlled.   RESP: Unlabored. RA  CARDIO: SR on monitor.   ABD: Soft, round. Incision with original surgical dressing. JP with SS drainage  GU: foley with pink tinged UOP  INTEG: dry, warm    Labs/Radiology  Pertinent Labs/radiology results reviewed.    Hedwig Morton, APRN-NP   Pager [phone]

## 2018-11-13 NOTE — Progress Notes
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  Donor: DCD, IRD: No, Age: 21s, female, Ht 183cm. Wt 105kg. Creatinine Initial/Terminal. 1.36/1.04  KDPI: 51%, cPRA:  0%  Match: VXM Negative, PXM Negative  CMV: D+/R+  Induction: thymoglobulin  Maintenance Immunosuppression: Triple drug therapy  Post OP: tbd ,CIT: tbd  Hr, WIT:  tbd  Min    TRANSPLANT SYNOPSIS#1:  - Jan/2015 complicated by DGF, CMV viremia, and failed in 2017     CC:   ESRD   S/P DDRT     Subjective/updates:  Feels okay, no acute events over night  Pain is under control   + nausea and vomiting in the morning   No chest pain or sob       REVIEW OF SYSTEMS: Comprehensive 14-point ROS reviewed  Positives noted in HPI otherwise negative.    Allergies:   Allergies   Allergen Reactions   ??? Zofran [Ondansetron Hcl (Pf)] HYPOTENSION   ??? Shellfish Containing Products HIVES and SHORTNESS OF BREATH     Pulmonary and loss of consciousness    ??? Levaquin [Levofloxacin] NAUSEA AND VOMITING and HYPOTENSION     Problem List:  Patient Active Problem List    Diagnosis Date Noted   ??? GERD (gastroesophageal reflux disease) 11/12/2018   ??? Vitamin D deficiency 11/12/2018   ??? Obesity 11/12/2018   ??? Insomnia 11/12/2018   ??? Immunosuppression (HCC) 11/12/2018   ??? 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 Acute tubular necrosis (HCC) 05/06/2013   ??? Nausea and vomiting 04/28/2013   ??? Kidney transplanted 03/11/2013   ??? S/P bilateral salpingo-oophorectomy 01/11/2013   ??? Ovarian cyst 12/10/2012   ??? Abnormal cardiovascular stress test 07/15/2011   ??? Shellfish allergy 07/15/2011   ??? HTN (hypertension) 07/15/2011   ??? Abnormal stress test 07/15/2011   ??? Pre-transplant evaluation for ESRD (end stage renal disease) 07/15/2011   ??? ESRD (end stage renal disease) (HCC) 07/03/2011 ??? Hemodialysis patient (HCC) 07/03/2011   ??? Polycystic kidney disease 06/27/2011   ??? Hypertension 06/27/2011   ??? Hyperlipidemia 06/27/2011       Current Medications:    Current Facility-Administered Medications:   ???  acetaminophen (TYLENOL) tablet 650 mg, 650 mg, Oral, Q4H PRN, Trilby Leaver, MD, 650 mg at 11/13/18 0911  ???  belladonna/opium (B&O) 16.2/60 mg rectal suppository 1 suppository, 1 suppository, Rectal, Q6H PRN, Trilby Leaver, MD  ???  bisacodyL (DULCOLAX) rectal suppository 10 mg, 10 mg, Rectal, QDAY PRN, Brokaw, Stephanie L, APRN-NP  ???  ceFAZolin (ANCEF) IVP 500 mg, 500 mg, Intravenous, Q12H*, 500 mg at 11/13/18 0859 **FOLLOWED BY** [START ON 11/14/2018] cephalexin (KEFLEX) capsule 500 mg, 500 mg, Oral, Q12H*, Nix, Vincenza Hews, MD  ???  heparin (porcine) PF syringe 5,000 Units, 5,000 Units, Subcutaneous, Q8H, Trilby Leaver, MD  ???  lidocaine (LIDODERM) 5 % topical patch 1 patch, 1 patch, Topical, QDAY, Stopped at 11/13/18 0123 **AND** Verification of Patch Placement and Integrity - Lidocaine 5%, , Transdermal, BID, Brokaw, Stephanie L, APRN-NP  ???  lymphocyte immune globulin, rabbit (THYMOGLOBULIN) 150 mg, hydrocortisone PF (Solu-CORTEF) 20 mg, heparin (porcine) 1,000 Units in sodium chloride 0.9% (NS) 531.4 mL IVPB, , Intravenous, ONCE, Brokaw, Stephanie L, APRN-NP, Last Rate: 88.6 mL/hr at 11/13/18 1325  ???  [COMPLETED] methylPREDNISolone (SOLU-MEDROL PF) injection 120 mg,  120 mg, Intravenous, QDAY, 120 mg at 11/13/18 1250 **FOLLOWED BY** [START ON 11/14/2018] methylPREDNISolone (SOLU-MEDROL PF) injection 80 mg, 80 mg, Intravenous, QDAY **FOLLOWED BY** [START ON 11/15/2018] methylPREDNISolone (SOLU-Medrol) injection 40 mg, 40 mg, Intravenous, QDAY **FOLLOWED BY** [START ON 11/16/2018] predniSONE (DELTASONE) tablet 30 mg, 30 mg, Oral, QDAY **FOLLOWED BY** [START ON 11/17/2018] predniSONE (DELTASONE) tablet 20 mg, 20 mg, Oral, QDAY, Trilby Leaver, MD  ???  mycophenolate DR (MYFORTIC) tablet 720 mg, 720 mg, Oral, BID, Nix, Quinn, MD, 720 mg at 11/13/18 0859  ???  nystatin (MYCOSTATIN) oral suspension 500,000 Units, 500,000 Units, Swish & Swallow, QID, Trilby Leaver, MD, 500,000 Units at 11/13/18 0900  ???  pantoprazole DR (PROTONIX) tablet 40 mg, 40 mg, Oral, QDAY(21), Brokaw, Stephanie L, APRN-NP  ???  polyethylene glycol 3350 (MIRALAX) packet 17 g, 1 packet, Oral, QDAY, Brokaw, Stephanie L, APRN-NP, 17 g at 11/13/18 0859  ???  prochlorperazine (COMPAZINE) injection 10 mg, 10 mg, Intravenous, Q6H PRN, Trilby Leaver, MD, 10 mg at 11/13/18 1005  ???  senna/docusate (SENOKOT-S) tablet 2 tablet, 2 tablet, Oral, BID, Brokaw, Stephanie L, APRN-NP, 2 tablet at 11/13/18 0859  ???  tacrolimus (PROGRAF) capsule 4.5 mg, 4.5 mg, Oral, BID(6-18), Brokaw, Stephanie L, APRN-NP, 4.5 mg at 11/13/18 0531  ???  traMADoL (ULTRAM) tablet 50 mg, 50 mg, Oral, Q6H PRN, Brokaw, Stephanie L, APRN-NP  ???  [START ON 11/14/2018] valGANciclovir (VALCYTE) tablet 450 mg, 450 mg, Oral, QDAY, Brokaw, Stephanie L, APRN-NP  ???  venlafaxine (EFFEXOR) tablet 37.5 mg, 37.5 mg, Oral, QDAY, Brokaw, Stephanie L, APRN-NP, 37.5 mg at 11/13/18 0859    Physical Exam:  BP 127/72 (BP Source: Arm, Right Upper)  - Pulse 77  - Temp 36.6 ???C (97.8 ???F)  - Ht 157.5 cm (62.01)  - Wt 96.6 kg (212 lb 15.4 oz)  - LMP 03/11/2010 Comment: Bilat. Tubes & Overies - SpO2 95%  - BMI 38.94 kg/m???     Intake/Output Summary (Last 24 hours) at 11/13/2018 1412  Last data filed at 11/13/2018 0919  Gross per 24 hour   Intake 1993.41 ml   Output 1130 ml   Net 863.41 ml     General: In NAD; A&Ox3  Skin: Warm, dry, no signs of rash   Neck: Supple   CV: Regular, regular rate, normal S1/S2, no murmurs  Lungs: CTA bilaterally in posterior fields  : Grossly normal without rebound, guarding, masses, or bruits   Transplant: LLQ, No overlying bruit, tender, incision dressing in place - clean, dry, and intact, + Drain   Ext: No edema  Neuro: No focal deficits   Psych: Affect appropriate  Dialysis Access: Rt [facility]     Laboratory studies: CMP:  CMP Latest Ref Rng & Units 11/13/2018 11/12/2018 11/12/2018 11/12/2018 03/25/2018   NA 137 - 147 MMOL/L 135(L) 137 137 140 142   K 3.5 - 5.1 MMOL/L 4.5 4.4 4.0 3.6 3.7   CL 98 - 110 MMOL/L 100 101 103 100 101   CO2 21 - 30 MMOL/L 22 22 25 28 30    GAP 3 - 12 13(H) 14(H) 9 12 11    BUN 7 - 25 MG/DL 16(X) 09(U) 04(V) 40(J) 47(H)   CR 0.4 - 1.00 MG/DL 8.11(B) 1.47(W) 2.95(A) 5.46(H) 5.03(H)   GLUX 70 - 100 MG/DL 213(Y) 865(H) 846(N) 96 96   CA 8.5 - 10.6 MG/DL 7.2(L) 7.2(L) 7.3(L) 9.4 9.6   TP 6.0 - 8.0 G/DL 5.0(L) - - 6.3 7.1   ALB 3.5 - 5.0 G/DL  3.0(L) - - 3.8 4.4   ALKP 25 - 110 U/L 37 - - 45 76   ALT 7 - 56 U/L 15 - - 5(L) 4(L)   TBILI 0.3 - 1.2 MG/DL 0.6 - - 0.6 0.8   GFR >45 mL/min 9(L) 9(L) 9(L) 8(L) 9(L)   GFRAA >60 mL/min 10(L) 10(L) 11(L) 10(L) 11(L)     Hemoglobin A1C (%)   Date Value   11/12/2018 5.3   05/07/2017 5.4   02/12/2016 5.1     PTH Hormone (PG/ML)   Date Value   11/12/2018 62.9   03/25/2018 559.8 (H)     Lipase   Date Value Ref Range Status   04/28/2013 19 11 - 82 U/L Final   09/25/2012 27 11 - 82 U/L Final     Amylase   Date Value Ref Range Status   04/28/2013 39 24 - 100 U/L Final   09/25/2012 61 24 - 100 U/L Final     BK Virus Plasma Quant (no units)   Date Value   07/01/2013 Negative for BK Virus   06/03/2013 Negative for BK Virus   05/19/2013 Negative for BK Virus   04/28/2013 Negative for BK Virus     CMV DNA Quant PCR (no units)   Date Value   11/12/2013 Positive for CMV   10/05/2013 Positive for CMV   09/22/2013 Negative for CMV   09/14/2013 Negative for CMV   09/08/2013 Negative for CMV     No results found for: IUMLBLD  No results found for: COPIES  EBV DNA, Quant. (no units)   Date Value   04/28/2013 Negative for EBV       TACROLIMUS LEVEL:  Tacrolimus Immunoassay (NG/ML)   Date Value   02/12/2016 9.5   12/09/2014 7.8   01/17/2014 6.4   12/31/2013 7.9   11/12/2013 8.4   10/05/2013 7.9   09/22/2013 7.4   09/14/2013 11.1       CBC with Diff: CBC with Diff Latest Ref Rng & Units 11/13/2018 11/12/2018   WBC 4.5 - 11.0 K/UL 12.5(H) 13.9(H)   RBC 4.0 - 5.0 M/UL 3.35(L) 3.39(L)   HGB 12.0 - 15.0 GM/DL 10.7(L) 10.9(L)   HCT 36 - 45 % 30.3(L) 31.6(L)   MCV 80 - 100 FL 90.3 93.1   MCH 26 - 34 PG 32.0 32.0   MCHC 32.0 - 36.0 G/DL 40.9 81.1   RDW 11 - 15 % 14.9 14.6   PLT 150 - 400 K/UL 114(L) 120(L)   MPV 7 - 11 FL 8.1 8.3   NEUT 41 - 77 % 97(H) -   ANC 1.8 - 7.0 K/UL 12.07(H) -   LYMA 24 - 44 % 0(L) -   ALYM 1.0 - 4.8 K/UL 0.03(L) -   MONA 4 - 12 % 3(L) -   AMONO 0 - 0.80 K/UL 0.34 -   EOSA 0 - 5 % 0 -   AEOS 0 - 0.45 K/UL 0.00 -   BASA 0 - 2 % 0 -   ABAS 0 - 0.20 K/UL 0.04 -     Lab Results   Component Value Date/Time    IRON 85 03/25/2013 07:41 AM    TIBC 249 (L) 03/25/2013 07:41 AM    PSAT 34 03/25/2013 07:41 AM    FERRITIN 263 (H) 03/25/2013 07:41 AM       Urinalysis:  Lab Results   Component Value Date/Time    UCOLOR YELLOW 11/12/2018 05:20 AM    TURBID  CLEAR 11/12/2018 05:20 AM    USPGR 1.008 11/12/2018 05:20 AM    UPH 5.0 11/12/2018 05:20 AM    UPROTEIN NEG 11/12/2018 05:20 AM    UAGLU NEG 11/12/2018 05:20 AM    UKET NEG 11/12/2018 05:20 AM    UBILE NEG 11/12/2018 05:20 AM    UBLD NEG 11/12/2018 05:20 AM    UROB NORMAL 11/12/2018 05:20 AM     Protein/CR ratio (no units)   Date Value   07/01/2013 0.4       Imaging:  Results for orders placed during the hospital encounter of 05/07/17   CT /PELV WO CONTRAST    Impression 1. 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 Some of these cystic renal lesions demonstrate intermediate density, likely representing  intracystic hemorrhage. Evaluation for renal neoplasm is limited in the absence of IV contrast.  2. No abdominal/pelvic inflammatory mass, ascites, or bowel obstruction.  3. Development of a irregular soft tissue nodule in the inferior left breast. Recommend mammography for further evaluation. Approved by Trey Sailors, M.D. on 05/07/2017 2:26 PM    By my electronic signature, I attest that I have personally reviewed the images for this examination and formulated the interpretations and opinions expressed in this report       Finalized by Leisa Lenz, M.D. on 05/07/2017 2:31 PM. Dictated by Trey Sailors, M.D. on 05/07/2017 2:00 PM.       Results for orders placed during the hospital encounter of 11/12/18   CHEST 2 VIEWS    Impression Minor zones of atelectasis in the left costophrenic angle.    Otherwise stable chest radiograph demonstrating no acute cardiopulmonary abnormalities.       Finalized by Darel Hong, M.D. on 11/12/2018 7:16 AM. Dictated by Darel Hong, M.D. on 11/12/2018 7:15 AM.         Patient Active Problem List    Diagnosis Date Noted   ??? GERD (gastroesophageal reflux disease) 11/12/2018   ??? Vitamin D deficiency 11/12/2018   ??? Obesity 11/12/2018   ??? Insomnia 11/12/2018   ??? Immunosuppression (HCC) 11/12/2018   ??? 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 Acute tubular necrosis (HCC) 05/06/2013   ??? Nausea and vomiting 04/28/2013   ??? Kidney transplanted 03/11/2013   ??? S/P bilateral salpingo-oophorectomy 01/11/2013   ??? Ovarian cyst 12/10/2012   ??? Abnormal cardiovascular stress test 07/15/2011   ??? Shellfish allergy 07/15/2011   ??? HTN (hypertension) 07/15/2011   ??? Abnormal stress test 07/15/2011   ??? Pre-transplant evaluation for ESRD (end stage renal disease) 07/15/2011   ??? 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 Hemodialysis patient (HCC) 07/03/2011   ??? Polycystic kidney disease 06/27/2011   ??? Hypertension 06/27/2011   ??? Hyperlipidemia 06/27/2011       Cardiovascular Screening  Echocardiogram: Date jan/2020 - EF 50-55%, No WMA, No valvular disease which could compromise the transplanted renal blood flow  NM Stress Test: Date Jan/2020, Negative for inducible reversible ischemia and is considered low risk Left Heart Catheterization: Date 2013: No angiographic evidence of significant obstructive coronary artery disease. Moderately depressed left anterior descending artery with an EF of 35 percent to 40 percent.    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 cPRA 0%, KDPI 51%    #) Immunosuppression - On maintenance Dual drug therapy  - Second dose thymo is today   - Goal tacrolimus trough first 3 months after transplant is 12-17 ng/mL (MEIA) or 8-12 mcg/L (HPLC/LCMS), HPLC preferred metric.  - Currently taking Prograf 4.5 mg BID, first level Saturday morning   - MPA 720mg  BID and steroid pulse to wean to prednisone free    #) Deceased donor renal transplant: bl Cr tbd mg/dL, bl UPCR tbc gm/gm  - Renal graft function: acceptable uop, scr is almost stable  - no indication for HD today   - Keep MAP>65  - LDH elevated, hapto low nl; repeat LDH/Hapto tomorrow     #) ID Proph  - CMV Donor(+)/ Recipient(+) to treat with Valcyte for 3 months post transplant  - PJP Prophylax: To treat with Bactrim/Septra for 1 year post transplant  - Nystatin swish for 2 weeks post transplant    #) BPs: at target, off depramine     #) Diabetes  - BGs have been under Fair control  - monitor     #) Anemia - Hgb average of 10, monitor     #) Volume/Electrolytes:   - K acceptable  - Ca unacceptable, low, monitor   - Mg is low, needs repletion   - Phos acceptable      Assalam, MD    [facility]  Service Pager: [phone]

## 2018-11-13 NOTE — Anesthesia Pain Rounding
Anesthesia Follow-Up Evaluation: Post-Procedure Day One    Name: Diana Cooley     MRN: [id number]     DOB: 08/20/1960     Age: 58 y.o.     Sex: female   __________________________________________________________________________     Procedure Date: 11/12/2018   Procedure: Procedure(s):  ALLOTRANSPLANTATION KIDNEY FROM NON LIVING DONOR WITHOUT RECIPIENT NEPHRECTOMY    Physical Assessment  Height: 157.5 cm (62.01)  Weight: 96.6 kg (212 lb 15.4 oz)    Vital Signs (Last Filed in 24 hours)  BP: 126/73 (09/04 0721)  Temp: 37.1 ???C (98.8 ???F) (09/04 6213)  Pulse: 84 (09/04 0625)  Respirations: 18 PER MINUTE (09/04 0625)  SpO2: 95 % (09/04 0625)  SpO2 Pulse: 89 (09/03 1230)  Height: 157.5 cm (62.01) (09/04 0865)    Patient History   Allergies  Allergies   Allergen Reactions   ??? Zofran [Ondansetron Hcl (Pf)] HYPOTENSION   ??? Shellfish Containing Products HIVES and SHORTNESS OF BREATH     Pulmonary and loss of consciousness    ??? Levaquin [Levofloxacin] NAUSEA AND VOMITING and HYPOTENSION        Medications  Scheduled Meds:ceFAZolin (ANCEF) IVP 500 mg, 500 mg, Intravenous, Q12H*    Followed by  [START ON 11/14/2018] cephalexin (KEFLEX) capsule 500 mg, 500 mg, Oral, Q12H*  heparin (porcine) PF syringe 5,000 Units, 5,000 Units, Subcutaneous, Q8H  lidocaine (LIDODERM) 5 % topical patch 1 patch, 1 patch, Topical, QDAY    And  Verification of Patch Placement and Integrity - Lidocaine 5%, , Transdermal, BID  methylPREDNISolone (SOLU-MEDROL PF) injection 120 mg, 120 mg, Intravenous, QDAY    Followed by  Melene Muller ON 11/14/2018] methylPREDNISolone (SOLU-MEDROL PF) injection 80 mg, 80 mg, Intravenous, QDAY    Followed by  Melene Muller ON 11/15/2018] methylPREDNISolone (SOLU-Medrol) injection 40 mg, 40 mg, Intravenous, QDAY    Followed by  Melene Muller ON 11/16/2018] predniSONE (DELTASONE) tablet 30 mg, 30 mg, Oral, QDAY    Followed by  Melene Muller ON 11/17/2018] predniSONE (DELTASONE) tablet 20 mg, 20 mg, Oral, QDAY mycophenolate DR (MYFORTIC) tablet 720 mg, 720 mg, Oral, BID  nystatin (MYCOSTATIN) oral suspension 500,000 Units, 500,000 Units, Swish & Swallow, QID  pantoprazole (PROTONIX) injection 40 mg, 40 mg, Intravenous, BID(11-21)  polyethylene glycol 3350 (MIRALAX) packet 17 g, 1 packet, Oral, QDAY  senna/docusate (SENOKOT-S) tablet 2 tablet, 2 tablet, Oral, BID  tacrolimus (PROGRAF) capsule 4.5 mg, 4.5 mg, Oral, BID(6-18)  [START ON 11/14/2018] valGANciclovir (VALCYTE) tablet 450 mg, 450 mg, Oral, QDAY  venlafaxine (EFFEXOR) tablet 37.5 mg, 37.5 mg, Oral, QDAY    Continuous Infusions:  ??? DOPamine 400 mg/D5W 250 mL infusion (std conc)(premade) 2 mcg/kg/min (11/13/18 0721)   ??? sodium chloride 0.45% (1/2NS) 1,000 mL with sodium bicarbonate 10 mEq IV infusion 100 mL/hr at 11/12/18 2224     PRN and Respiratory Meds:acetaminophen Q4H PRN, belladonna/opium Q6H PRN, bisacodyL QDAY PRN, HYDROmorphone (DILAUDID) injection Q6H PRN, oxyCODONE Q4H PRN, prochlorperazine Q6H PRN      Diagnostic Tests  Hematology:   Lab Results   Component Value Date    HGB 10.7 11/13/2018    HCT 30.3 11/13/2018    PLTCT 114 11/13/2018    WBC 12.5 11/13/2018    NEUT 97 11/13/2018    ANC 12.07 11/13/2018    LYMPH 4 07/01/2013    ALC 0.03 11/13/2018    MONA 3 11/13/2018    AMC 0.34 11/13/2018    EOSA 0 11/13/2018    ABC 0.04 11/13/2018    BASOPHILS 1 07/01/2013  MCV 90.3 11/13/2018    MCH 32.0 11/13/2018    MCHC 35.4 11/13/2018    MPV 8.1 11/13/2018    RDW 14.9 11/13/2018         General Chemistry:   Lab Results   Component Value Date    NA 135 11/13/2018    K 4.5 11/13/2018    CL 100 11/13/2018    CO2 22 11/13/2018    GAP 13 11/13/2018    BUN 42 11/13/2018    CR 5.21 11/13/2018    GLU 196 11/13/2018    GLU 105 04/18/2015    CA 7.2 11/13/2018    ALBUMIN 3.0 11/13/2018    MG 1.3 11/13/2018    TOTBILI 0.6 11/13/2018    PO4 3.7 11/13/2018      Coagulation:   Lab Results   Component Value Date    PT 10.9 03/29/2013    PTT 24.5 11/12/2018 INR 1.1 11/12/2018         Follow-Up Assessment  Patient location during evaluation: floor      Anesthetic Complications:   Anesthetic complications: The patient did not experience any anesthestic complications.      Pain:  Score: 0    Management:adequate     Level of Consciousness: awake and alert   Hydration:acceptable     Airway Patency:  Respiratory Status: acceptable     Cardiovascular Status:acceptable   Regional/Neuroaxial:

## 2018-11-13 NOTE — Progress Notes
PHYSICAL THERAPY  ASSESSMENT      Name: Diana Cooley        MRN: [id number]          DOB: [birth year]          Age: 58 y.o.  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   Mental / Cognitive Status: Alert;Follows Commands;Anxious  Persons Present: Nursing Staff  Pain: Patient complains of pain;Patient does not rate pain;Before activity;During activity;After activity  Pain Location: Post-surgical(and at IV site)  Pain Interventions: Patient agrees to participate in therapy with modifications to session;Treatment altered to patient's pain tolerance  Comments: SBP 127 in supine; pt reports dizziness in sitting- SBP 116; pt reports increased dizziness in standing SBP 106. Nursing staff present and aware. Pt reports being overwhelmed and is very tearful throughout session.   Ambulation Assist: Independent Mobility in [employer] without Device  Patient Owned Equipment: None  Home Situation: Lives with Family  Type of Home: House  Entry Stairs: 3-5 Stairs;No Rail  In-Home Stairs: Able to Live on One Level;1-2 Flights of Stairs;Rail on 1 Side(to laundry)    ROM  LE ROM WFL: Yes    Strength  Overall Strength: WFL    Bed Mobility/Transfer  Bed Mobility: Rolling: Log Roll;Minimal Assist  Bed Mobility: Supine to Sit: Moderate Assist;Assist with Trunk;Head of Bed Elevated;Use of Rail  Bed Mobility: Sit to Supine: Moderate Assist;Assist with B LE;HOB Elevated;Use of Rail  Transfer Type: Sit to/from Stand  Transfer: Assistance Level: To/From;Bed;Minimal Assist;x2 People  Transfer: Assistive Device: Doctor, general practice  Transfers: Type Of Assistance: For Balance;For Safety Considerations End Of Activity Status: In Bed;Nursing Notified;Instructed Patient to Request Assist with Mobility;Instructed Patient to Use Call Light(bed alarm on)    Education  Persons Educated: Patient  Patient Barriers To Learning: None Noted  Teaching Methods: Verbal Instruction  Patient Response: Verbalized Understanding  Topics: Plan/Goals of PT Interventions;Importance of Increasing Activity;Recommend Continued Therapy    Assessment/Progress  Impaired Mobility Due To: Impaired Balance;Decreased Activity Tolerance  Assessment/Progress: Should Improve w/ Continued PT    AM-PAC 6 Clicks Basic Mobility Inpatient  Turning from your back to your side while in a flat bed without using bed rails: A Little  Moving from lying on your back to sitting on the side of a flatbed without using bedrails : A Lot  Moving to and from a bed to a chair (including a wheelchair): Total  Standing up from a chair using your arms (e.g. wheelchair, or bedside chair): Total  To walk in hospital room: Total  Climbing 3-5 steps with a railing: Total  Raw Score: 9  Standardized (T-scale) Score: 25.8  Basic Mobility CMS 0-100%: 77.59  CMS G Code Modifier for Basic Mobility: CL    Goals  Goal Formulation: With Patient  Time For Goal Achievement: 3 days  Patient Will Go Supine To/From Sit: w/ Stand By Assist  Patient Will Transfer Sit to Stand: w/ Stand By Assist  Patient Will Ambulate: 101-150 Feet, w/ Stand By Assist(with least restrictive device. )  Patient Will Go Up / Down Stairs: 3-5 Stairs, w/ Stand By Assist  Plan  Treatment Interventions: Mobility Training  Plan Frequency: 5-7 Days per Week  PT Plan for Next Visit: Work on transfers and begin gait as able.     PT Discharge Recommendations  Recommendation: Currently patient requires inpatient level of care. However, typical progression for patient condition would anticipate home with assistance at time of discharge.  Patient Currently Requires Physical Assist With: All mobility Therapist  Ranae Plumber, PT  Date  11/13/2018

## 2018-11-13 NOTE — Consults
CLINICAL NUTRITION                                                        Clinical Nutrition Initial Assessment  Transplant/Discharge Planning Phase    Name: Diana Cooley        MRN: [id number]          DOB: 1960/03/16          Age: 58 y.o.  Admission Date: 11/12/2018             LOS: 1 day        Recommendation:  ??? Recommend ADAT to Regular/non-therapeutic.   ??? Avoid prolonged CLD.   ??? Encourage PO efforts.    Comments:  58 y.o. 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 This is her second kidney transplant; she underwent first transplant in 2015.  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  She remains on triple therapy with prednisone, tacrolimus, and azathioprine, her allograft failed in 2017. She has been on hemodialysis since 2017 via tunneled catheter.  She undergoes home hemodialysis 4 times per week.  She still makes 6 to 700 mL's of urine per day. Pt very tired during visit but participatory. Reports good appetite and intake, typically 3 meals/day with protein. Taking Super B vitamin and Ca +D3 PTA. Reports post-operative issues with nausea/vomiting this morning. LBM PTA.    Pt has been educated on post transplant nutrition guidelines including food safety, hand hygiene, grapefruit and pomegranate avoidance. Pt and/or family were given the opportunity to ask questions. Diet has been advanced to clear liquid diet and pt is tolerating diet without sever GI distress. Pt is eating inadequately, with slowly improving intake. Pt is planning to discharge to home in [place] (addressed with pt the need to stay locally post transplant) and spouse will be preparing foods. Pt is ready for discharge from a nutritional perspective pending tolerance of advancing diet. Nutrition Assessment of Patient:  Admit Weight: 95.3 kg;  ; Desired Weight: 61.7 kg  BMI (Calculated): 38.94; BMI Categories Adult: Obesity Class II: 35-39.9; Appearance: Obese    Pertinent Allergies/Intolerances: Shellfish  Pertinent Labs: Na 135, Cr 5.21, Mg 1.3; Pertinent Meds: Heparin, mg sulf w/D5, nystatin, miralax, compazine;    Oral Diet Order: Clear liquid;    Current Oral Intake: Inadequate(CLD with poor intake)  Estimated Calorie Needs: 1850-2160 kcal (30-35 kcal/kg DBW 61.7 kg)  Estimated Protein Needs: 80-111 gm (1.3-1.8 gm/kg DBW 61.7 kg)    Malnutrition Assessment:   Does not meet criteria;      Nutrition Focused Physical Assessment:   Loss of Subcutaneous Fat: No;      Muscle Wasting: No;      Edema: Yes; Severity: Mild; Location: Upper extremities, Lower extremities(BUE/BLE trace)  Pressure Injury: none noted    Nutrition Diagnosis:  Increased nutrient needs, specify:(calories/protein)  Etiology: increased metabolic demands  Signs & Symptoms: s/p DDRT 9/3    Intervention / Plan:  Monitor for diet advancement, PO intake, wt trends, labs, GI health    Goals:  Avoid prolonged clear liquid status  Time Frame: Within 24 hours  Patient to consume >75% of meals  Time Frame: Throughout stay  Verbalize understanding of diet  Time Frame: Prior to discharge  Status: Met;Ongoing     Margarita Grizzle, MS, RD, LD   Desk phone [phone] - Available on [facility]

## 2018-11-13 NOTE — Case Management (ED)
Case Management Progress Note  Discharge Planning Phase  NAME:Diana Cooley                         MRN: [id number]              DOB:May 19, 1960          AGE: 58 y.o.  ADMISSION DATE: 11/12/2018             DAYS ADMITTED: LOS: 1 day      Today???s Date: 11/13/2018    Plan Anticipate dc home with husband; Diana Cooley pending medical stability. Diana Cooley to provide support and transportation at discharge.       Interventions  ? Support       SW provided pt with information on the Kidney Transplant Support Group that is held at the [facility]plant [facility] and the [facility].     SW provided pt with writing your donor family pamphlet should pt ever be interested in writing to her donor family. SW explained the letter writing process and explained how letters are sent by the recipients and then received by the donors.     SW called and reviewed the above resources with pt via hospital phone. Stated onsite SW would be coming to deliver resources. No questions or concerns related to resources identified at this time.     CM will continue to follow for discharge planning.    ? Info or Referral      ? Discharge Planning    SW participated in huddle via conference call. Pt is POD 1 from DDRT. Team stated pt may be able to discharge as early as Sunday or Monday.     ? Medication Needs           ? Financial     SW provided pt with ESRD Medicare booklet and contact information to pt's local social security office. SW completed 2728 ESRD form and explained the purpose of the form to pt. SW explained that 2728 form to be sent to pt's local social security office and that pt will need to contact social security directly to elect to enroll in coverage. SW had pt sign this form to notify Medicare that he has received a kidney transplant. SW had MD complete and then mailed to pt's local social security office and to [employer].     SW e-mailed a copy of the form to outpatient SW E. Haire and K. Ingold.    ? Legal ? Other        Disposition  ? Expected Discharge Date    Expected Discharge Date: 11/15/18  Expected Discharge Time: 1200  ? Transportation   Does the patient need discharge transport arranged?: No  Transportation Name, Phone and Availability #1: Husband; Diana Cooley  ? Next Level of Care (Acute Psych discharges only)      ? Discharge Disposition              Case Management Admission Assessment  Transplant Phase  NAME:Diana Cooley                          MRN: [id number]             DOB:[birth year]          AGE: 58 y.o.  ADMISSION DATE: 11/12/2018             DAYS ADMITTED: LOS: 1 day  Today???s Date: 11/13/2018    Source of Information: Patient via hospital phone      Plan  Plan: Case Management Assessment, Transplant Assessment, Assist PRN with SW/NCM Services, Discharge Planning for Home Anticipated    SW called pt on hospital phone at 11:35AM and again at 1:15PM to complete admission assessment.   SW called for third time at 3:49PM ans was able to get a hold of pt.    Pt reports being completely independent prior to admission.   Denies prior use of DME, O2 or HH. Also denies prior admission to SNF/IPR/LTACH.   Pt stated her husband; Diana Cooley would be able to provide 24/7 care and support if needed. Stated he would also be providing transportation to and from follow up appointments.     CM will continue to follow for discharge planning.   No needs identified at this time.  Patient Address/Phone  [address]  [place] [address]  [phone] (home)     Emergency Contact  Extended Emergency Contact Information  Primary Emergency Contact: Cooley,Diana  Address: [address]           [place] [address] [place]  Home Phone: [phone]  Mobile Phone: [phone]  Relation: Spouse  Preferred language: ENGLISH    Healthcare Directive  Healthcare Directive: Yes, patient has a healthcare directive  Type of Healthcare Directive: Durable power of attorney for healthcare Location of Healthcare Directive: Current and verified in document scanning system  Would patient like to fill out a (a new) Healthcare Directive?: No, patient declined      Transportation  Does the patient need discharge transport arranged?: No  Transportation Name, Phone and Availability #1: Husband; Diana Cooley    Expected Discharge Date  Expected Discharge Date: 11/15/18  Expected Discharge Time: 1200    Living Situation Prior to Admission  ? Living Arrangements  Type of Residence: Home, independent  Living Arrangements: Spouse/significant other  Financial risk analyst / Tub: Tub/Shower Unit  How many levels in the residence?: 1  Can patient live on one level if needed?: Yes  Does residence have entry and/or side stairs?: Yes(2 steps)  Assistance needed prior to admit or anticipated on discharge: No  Who provides assistance or could if needed?: Husband; Diana Cooley  Are they in good health?: Yes  Can support system provide 24/7 care if needed?: Yes  ? Level of Function   Prior level of function: Independent  ? Cognitive Abilities   Cognitive Abilities: Alert and Oriented, Engages in problem solving and planning, Participates in decision making, Recognizes impact of health condition on lifestyle    Financial Resources  ? Coverage  Primary Insurance: Medicare  Secondary Insurance: Medicare Supplement  Additional Coverage: RX    ? Source of Income   Source Of Income: SSDI  ? Financial Assistance Needed?  N/A    Psychosocial Needs  ? Mental Health  Mental Health History: No  ? Substance Use History  Substance Use History Screen: No  ? Other  SW inquired how pt has been coping since Kidney Transplant. Pt elaborated that this isn't her first Kidney Transplant. Stated she feels like things have been going well, and she wants to remain hopeful.  Stated she if really sore, but anticipates she will feel better everyday.     Current/Previous Services  ? PCP  Diana Cooley, [phone], [phone]  ? Pharmacy    [facility] [address]  [place] [address]  Phone: [phone] Fax:  [phone]    ? Durable Medical Equipment   Durable Medical Equipment at home: Other (comment)(HD supplies)  ? Home Health  Receiving home health: No  ? Hemodialysis or Peritoneal Dialysis  Undergoing hemodialysis or peritoneal dialysis: Yes  Hemodialysis or Peritoneal Dialysis: Hemodialysis  Location: Home HD through [facility]  Days attending: (4 nights a week)  ? Tube/Enteral Feeds  Receive tube/enteral feeds: No  ? Infusion  Receive infusions: No  ? Private Duty  Private duty help used: No  ? Home and Community Based Services  Home and community based services: No  ? Ryan White  Ryan White: No  ? Hospice  Hospice: No  ? Outpatient Therapy  PT: No  OT: No  SLP: No  ? Skilled Nursing Facility/Nursing Home  SNF: No  NH: No  ? Inpatient Rehab  IPR: No  ? Long-Term Acute Care Hospital  LTACH: No  ? Acute Hospital Stay  Acute Hospital Stay: No    Charleston Ropes, LMSW  [phone]

## 2018-11-13 NOTE — Progress Notes
0900: Patient given iPad for transplant education. Pt's husband not at bedside at this time. Pt instructed to watch video and NC will round on pt at later time to answer any questions.     1305: NC rounded on pt. Pt stated she has not completed education video. iPad left with pt to complete. NC discussed with bedside RN and RN will pick up iPad from pt this evening. Follow up appointments are scheduled and lab orders placed.

## 2018-11-13 NOTE — Progress Notes
Patient is POD 1, last BM prior to admission, Foley in place, PT/OT consulted, patient's Pain is controlled with current analgesics.  Medication(s) being used:   narcotic analgesics including  oxycodone (Oxycontin, Oxyir) for pain control, diet clear. Patient not ready for discharge.    Patient's medication costs are as follows:   Tacrolimus: $0   Mycophenolate: $0   Valganciclovir: $ 130.98

## 2018-11-14 ENCOUNTER — Encounter: Admit: 2018-11-14 | Discharge: 2018-11-14

## 2018-11-14 DIAGNOSIS — Q612 Polycystic kidney, adult type: Secondary | ICD-10-CM

## 2018-11-14 MED ORDER — VALGANCICLOVIR 450 MG PO TAB
450 mg | ORAL | 0 refills | Status: DC
Start: 2018-11-14 — End: 2018-11-17
  Administered 2018-11-17: 14:00:00 450 mg via ORAL

## 2018-11-14 MED ORDER — DIPHENHYDRAMINE HCL 25 MG PO CAP
25 mg | Freq: Once | ORAL | 0 refills | Status: CP
Start: 2018-11-14 — End: ?
  Administered 2018-11-14: 18:00:00 25 mg via ORAL

## 2018-11-14 MED ORDER — LYMPHOCYTE IMMUNE GLOBULIN (RAB) IVPB (PERIPHERAL)
Freq: Once | INTRAVENOUS | 0 refills | Status: CP
Start: 2018-11-14 — End: ?
  Administered 2018-11-14 (×4): 531.400 mL via INTRAVENOUS

## 2018-11-14 MED ORDER — ACETAMINOPHEN 325 MG PO TAB
650 mg | Freq: Once | ORAL | 0 refills | Status: CP
Start: 2018-11-14 — End: ?
  Administered 2018-11-14: 18:00:00 650 mg via ORAL

## 2018-11-14 NOTE — Progress Notes
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  Donor: DCD, IRD: No, Age: 51s, female, Ht 183cm. Wt 105kg. Creatinine Initial/Terminal. 1.36/1.04  KDPI: 51%, cPRA:  0%  Match: VXM Negative, PXM Negative  CMV: D+/R+  Induction: thymoglobulin  Maintenance Immunosuppression: Triple drug therapy  Post OP: tbd ,CIT: tbd  Hr, WIT:  tbd  Min    TRANSPLANT SYNOPSIS#1:  - Jan/2015 complicated by DGF, CMV viremia, and failed in 2017     CC:   ESRD   S/P DDRT     Subjective/updates:  Feels okay, no acute events over night  Pain is under control   No nausea or vomiting  + Bowel movements   No chest pain or sob       REVIEW OF SYSTEMS: Comprehensive 14-point ROS reviewed  Positives noted above otherwise negative.    Allergies:   Allergies   Allergen Reactions   ??? Zofran [Ondansetron Hcl (Pf)] HYPOTENSION   ??? Shellfish Containing Products HIVES and SHORTNESS OF BREATH     Pulmonary and loss of consciousness    ??? Levaquin [Levofloxacin] NAUSEA AND VOMITING and HYPOTENSION     Problem List:  Patient Active Problem List    Diagnosis Date Noted   ??? GERD (gastroesophageal reflux disease) 11/12/2018   ??? Vitamin D deficiency 11/12/2018   ??? Obesity 11/12/2018   ??? Insomnia 11/12/2018   ??? Immunosuppression (HCC) 11/12/2018   ??? 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 Acute tubular necrosis (HCC) 05/06/2013   ??? Nausea and vomiting 04/28/2013   ??? Kidney transplanted 03/11/2013   ??? S/P bilateral salpingo-oophorectomy 01/11/2013   ??? Ovarian cyst 12/10/2012   ??? Abnormal cardiovascular stress test 07/15/2011   ??? Shellfish allergy 07/15/2011   ??? HTN (hypertension) 07/15/2011   ??? Abnormal stress test 07/15/2011   ??? Pre-transplant evaluation for ESRD (end stage renal disease) 07/15/2011   ??? ESRD (end stage renal disease) (HCC) 07/03/2011 ??? Hemodialysis patient (HCC) 07/03/2011   ??? Polycystic kidney disease 06/27/2011   ??? Hypertension 06/27/2011   ??? Hyperlipidemia 06/27/2011       Current Medications:    Current Facility-Administered Medications:   ???  acetaminophen (TYLENOL) tablet 650 mg, 650 mg, Oral, Q4H PRN, Trilby Leaver, MD, 650 mg at 11/13/18 0911  ???  belladonna/opium (B&O) 16.2/60 mg rectal suppository 1 suppository, 1 suppository, Rectal, Q6H PRN, Trilby Leaver, MD  ???  bisacodyL (DULCOLAX) rectal suppository 10 mg, 10 mg, Rectal, QDAY PRN, Brokaw, Stephanie L, APRN-NP  ???  [COMPLETED] ceFAZolin (ANCEF) IVP 500 mg, 500 mg, Intravenous, Q12H*, 500 mg at 11/14/18 0843 **FOLLOWED BY** cephalexin (KEFLEX) capsule 500 mg, 500 mg, Oral, Q12H*, Nix, Vincenza Hews, MD  ???  heparin (porcine) PF syringe 5,000 Units, 5,000 Units, Subcutaneous, Q8H, Trilby Leaver, MD  ???  lidocaine (LIDODERM) 5 % topical patch 1 patch, 1 patch, Topical, QDAY, 1 patch at 11/14/18 1242 **AND** Verification of Patch Placement and Integrity - Lidocaine 5%, , Transdermal, BID, Brokaw, Stephanie L, APRN-NP  ???  lymphocyte immune globulin, rabbit (THYMOGLOBULIN) 150 mg, hydrocortisone PF (Solu-CORTEF) 20 mg, heparin (porcine) 1,000 Units in sodium chloride 0.9% (NS) 531.4 mL IVPB, , Intravenous, ONCE, Nix, Quinn, MD, Last Rate: 88.6 mL/hr at 11/14/18 1324  ???  [COMPLETED] methylPREDNISolone (SOLU-MEDROL PF) injection 120 mg, 120 mg,  Intravenous, QDAY, 120 mg at 11/13/18 1250 **FOLLOWED BY** [COMPLETED] methylPREDNISolone (SOLU-MEDROL PF) injection 80 mg, 80 mg, Intravenous, QDAY, 80 mg at 11/14/18 1244 **FOLLOWED BY** [START ON 11/15/2018] methylPREDNISolone (SOLU-Medrol) injection 40 mg, 40 mg, Intravenous, QDAY **FOLLOWED BY** [START ON 11/16/2018] predniSONE (DELTASONE) tablet 30 mg, 30 mg, Oral, QDAY **FOLLOWED BY** [START ON 11/17/2018] predniSONE (DELTASONE) tablet 20 mg, 20 mg, Oral, QDAY, Trilby Leaver, MD  ???  mycophenolate DR (MYFORTIC) tablet 720 mg, 720 mg, Oral, BID, Trilby Leaver, MD, 720 mg at 11/14/18 0843  ???  nystatin (MYCOSTATIN) oral suspension 500,000 Units, 500,000 Units, Swish & Swallow, QID, Trilby Leaver, MD, 500,000 Units at 11/14/18 1243  ???  pantoprazole DR (PROTONIX) tablet 40 mg, 40 mg, Oral, QDAY(21), Brokaw, Stephanie L, APRN-NP, 40 mg at 11/13/18 2004  ???  polyethylene glycol 3350 (MIRALAX) packet 17 g, 1 packet, Oral, QDAY, Brokaw, Stephanie L, APRN-NP, 17 g at 11/13/18 0859  ???  prochlorperazine (COMPAZINE) injection 10 mg, 10 mg, Intravenous, Q6H PRN, Trilby Leaver, MD, 10 mg at 11/13/18 1005  ???  senna/docusate (SENOKOT-S) tablet 2 tablet, 2 tablet, Oral, BID, Brokaw, Stephanie L, APRN-NP, 2 tablet at 11/14/18 0843  ???  traMADoL (ULTRAM) tablet 50 mg, 50 mg, Oral, Q6H PRN, Brokaw, Stephanie L, APRN-NP  ???  [START ON 11/17/2018] valGANciclovir (VALCYTE) tablet 450 mg, 450 mg, Oral, Once per day on Tue Sat, Provider, Pharmacy  ???  venlafaxine (EFFEXOR) tablet 37.5 mg, 37.5 mg, Oral, QDAY, Brokaw, Stephanie L, APRN-NP, 37.5 mg at 11/14/18 0843    Physical Exam:  BP (!) 153/87 (BP Source: Arm, Right Upper)  - Pulse 84  - Temp 36.9 ???C (98.4 ???F)  - Ht 157.5 cm (62.01)  - Wt 93 kg (205 lb)  - LMP 03/11/2010 Comment: Bilat. Tubes & Overies - SpO2 98%  - BMI 37.49 kg/m???     Intake/Output Summary (Last 24 hours) at 11/14/2018 1338  Last data filed at 11/14/2018 1333  Gross per 24 hour   Intake 1771 ml   Output 1205 ml   Net 566 ml     General: In NAD; A&Ox3  Skin: Warm, dry, no signs of rash   Neck: Supple   CV: Regular, regular rate, normal S1/S2, no murmurs  Lungs: CTA bilaterally in posterior fields  : Grossly normal without rebound, guarding, masses, or bruits   Transplant: LLQ, No overlying bruit, tender, incision dressing in place - clean, dry, and intact, + Drain   Ext: No edema  Neuro: No focal deficits   Psych: Affect appropriate  Dialysis Access: Rt IJ [facility]     Laboratory studies:     CMP:  CMP Latest Ref Rng & Units 11/14/2018 11/13/2018 11/12/2018 11/12/2018 11/12/2018 NA 137 - 147 MMOL/L 137 135(L) 137 137 140   K 3.5 - 5.1 MMOL/L 4.3 4.5 4.4 4.0 3.6   CL 98 - 110 MMOL/L 102 100 101 103 100   CO2 21 - 30 MMOL/L 19(L) 22 22 25 28    GAP 3 - 12 16(H) 13(H) 14(H) 9 12   BUN 7 - 25 MG/DL 16(X) 09(U) 04(V) 40(J) 38(H)   CR 0.4 - 1.00 MG/DL 8.11(B) 1.47(W) 2.95(A) 4.99(H) 5.46(H)   GLUX 70 - 100 MG/DL 213(Y) 865(H) 846(N) 629(B) 96   CA 8.5 - 10.6 MG/DL 7.6(L) 7.2(L) 7.2(L) 7.3(L) 9.4   TP 6.0 - 8.0 G/DL 2.8(U) 1.3(K) - - 6.3   ALB 3.5 - 5.0 G/DL 4.4(W) 1.0(U) - - 3.8   ALKP 25 - 110  U/L 42 37 - - 45   ALT 7 - 56 U/L 9 15 - - 5(L)   TBILI 0.3 - 1.2 MG/DL 0.5 0.6 - - 0.6   GFR >04 mL/min 8(L) 9(L) 9(L) 9(L) 8(L)   GFRAA >60 mL/min 10(L) 10(L) 10(L) 11(L) 10(L)     Hemoglobin A1C (%)   Date Value   11/12/2018 5.3   05/07/2017 5.4   02/12/2016 5.1     PTH Hormone (PG/ML)   Date Value   11/12/2018 62.9   03/25/2018 559.8 (H)     Lipase   Date Value Ref Range Status   04/28/2013 19 11 - 82 U/L Final   09/25/2012 27 11 - 82 U/L Final     Amylase   Date Value Ref Range Status   04/28/2013 39 24 - 100 U/L Final   09/25/2012 61 24 - 100 U/L Final     BK Virus Plasma Quant (no units)   Date Value   07/01/2013 Negative for BK Virus   06/03/2013 Negative for BK Virus   05/19/2013 Negative for BK Virus   04/28/2013 Negative for BK Virus     CMV DNA Quant PCR (no units)   Date Value   11/12/2013 Positive for CMV   10/05/2013 Positive for CMV   09/22/2013 Negative for CMV   09/14/2013 Negative for CMV   09/08/2013 Negative for CMV     No results found for: IUMLBLD  No results found for: COPIES  EBV DNA, Quant. (no units)   Date Value   04/28/2013 Negative for EBV       TACROLIMUS LEVEL:  Tacrolimus Immunoassay (NG/ML)   Date Value   11/14/2018 25.0 (HH)   02/12/2016 9.5   12/09/2014 7.8   01/17/2014 6.4   12/31/2013 7.9   11/12/2013 8.4   10/05/2013 7.9   09/22/2013 7.4       CBC with Diff:  CBC with Diff Latest Ref Rng & Units 11/14/2018 11/13/2018   WBC 4.5 - 11.0 K/UL 10.8 12.5(H) RBC 4.0 - 5.0 M/UL 3.60(L) 3.35(L)   HGB 12.0 - 15.0 GM/DL 11.5(L) 10.7(L)   HCT 36 - 45 % 33.2(L) 30.3(L)   MCV 80 - 100 FL 92.1 90.3   MCH 26 - 34 PG 32.0 32.0   MCHC 32.0 - 36.0 G/DL 54.0 98.1   RDW 11 - 15 % 14.7 14.9   PLT 150 - 400 K/UL 121(L) 114(L)   MPV 7 - 11 FL 8.4 8.1   NEUT 41 - 77 % 96(H) 97(H)   ANC 1.8 - 7.0 K/UL 10.44(H) 12.07(H)   LYMA 24 - 44 % 1(L) 0(L)   ALYM 1.0 - 4.8 K/UL 0.07(L) 0.03(L)   MONA 4 - 12 % 3(L) 3(L)   AMONO 0 - 0.80 K/UL 0.28 0.34   EOSA 0 - 5 % 0 0   AEOS 0 - 0.45 K/UL 0.00 0.00   BASA 0 - 2 % 0 0   ABAS 0 - 0.20 K/UL 0.03 0.04     Lab Results   Component Value Date/Time    IRON 85 03/25/2013 07:41 AM    TIBC 249 (L) 03/25/2013 07:41 AM    PSAT 34 03/25/2013 07:41 AM    FERRITIN 263 (H) 03/25/2013 07:41 AM       Urinalysis:  Lab Results   Component Value Date/Time    UCOLOR YELLOW 11/12/2018 05:20 AM    TURBID CLEAR 11/12/2018 05:20 AM    USPGR 1.008 11/12/2018 05:20  AM    UPH 5.0 11/12/2018 05:20 AM    UPROTEIN NEG 11/12/2018 05:20 AM    UAGLU NEG 11/12/2018 05:20 AM    UKET NEG 11/12/2018 05:20 AM    UBILE NEG 11/12/2018 05:20 AM    UBLD NEG 11/12/2018 05:20 AM    UROB NORMAL 11/12/2018 05:20 AM     Protein/CR ratio (no units)   Date Value   07/01/2013 0.4       Imaging:  Results for orders placed during the hospital encounter of 05/07/17   CT /PELV WO CONTRAST    Impression 1. 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 Some of these cystic renal lesions demonstrate intermediate density, likely representing  intracystic hemorrhage. Evaluation for renal neoplasm is limited in the absence of IV contrast.  2. No abdominal/pelvic inflammatory mass, ascites, or bowel obstruction.  3. Development of a irregular soft tissue nodule in the inferior left breast. Recommend mammography for further evaluation.         Approved by Trey Sailors, M.D. on 05/07/2017 2:26 PM By my electronic signature, I attest that I have personally reviewed the images for this examination and formulated the interpretations and opinions expressed in this report       Finalized by Leisa Lenz, M.D. on 05/07/2017 2:31 PM. Dictated by Trey Sailors, M.D. on 05/07/2017 2:00 PM.       Results for orders placed during the hospital encounter of 11/12/18   CHEST 2 VIEWS    Impression Minor zones of atelectasis in the left costophrenic angle.    Otherwise stable chest radiograph demonstrating no acute cardiopulmonary abnormalities.       Finalized by Darel Hong, M.D. on 11/12/2018 7:16 AM. Dictated by Darel Hong, M.D. on 11/12/2018 7:15 AM.         Patient Active Problem List    Diagnosis Date Noted   ??? GERD (gastroesophageal reflux disease) 11/12/2018   ??? Vitamin D deficiency 11/12/2018   ??? Obesity 11/12/2018   ??? Insomnia 11/12/2018   ??? Immunosuppression (HCC) 11/12/2018   ??? 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 Acute tubular necrosis (HCC) 05/06/2013   ??? Nausea and vomiting 04/28/2013   ??? Kidney transplanted 03/11/2013   ??? S/P bilateral salpingo-oophorectomy 01/11/2013   ??? Ovarian cyst 12/10/2012   ??? Abnormal cardiovascular stress test 07/15/2011   ??? Shellfish allergy 07/15/2011   ??? HTN (hypertension) 07/15/2011   ??? Abnormal stress test 07/15/2011   ??? Pre-transplant evaluation for ESRD (end stage renal disease) 07/15/2011   ??? 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 Hemodialysis patient (HCC) 07/03/2011   ??? Polycystic kidney disease 06/27/2011   ??? Hypertension 06/27/2011   ??? Hyperlipidemia 06/27/2011       Cardiovascular Screening  Echocardiogram: Date jan/2020 - EF 50-55%, No WMA, No valvular disease which could compromise the transplanted renal blood flow  NM Stress Test: Date Jan/2020, Negative for inducible reversible ischemia and is considered low risk   Left Heart Catheterization: Date 2013: No angiographic evidence of significant obstructive coronary artery disease. Moderately depressed left anterior descending artery with an EF of 35 percent to 40 percent.    Cancer Screening  Colonoscopy: Date 2013 - 3 polyps tubular adenomas   Mammogram: Date sep/2019 - normal  PAP: Date apr/ 2019 - normal    Assessment and Plan:  Ms. 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 cPRA 0%, KDPI 51%    #) Immunosuppression - On maintenance Dual drug therapy  - Second dose thymo is today   - Goal tacrolimus trough first 3 months after transplant is 12-17 ng/mL (MEIA) or 8-12 mcg/L (HPLC/LCMS), HPLC preferred metric.  - Currently taking Prograf 4.5 mg BID, trough is supra therapeutic, will hold prograf    - MPA 720mg  BID and steroid pulse to wean to prednisone free    #) Deceased donor renal transplant: bl Cr tbd mg/dL, bl UPCR tbc gm/gm  - Renal graft function: acceptable uop, scr is almost stable, in setting of supra therapeutic progaraf  - no indication for HD today   - Keep MAP>65  - LDH elevated today, hapto low nl yesterday;   - repeat LDH/Hapto tomorrow   - Repeat Renal US today     #) ID Proph  - CMV Donor(+)/ Recipient(+) to treat with Valcyte for 3 months post transplant  - PJP Prophylax: To treat with Bactrim/Septra for 1 year post transplant  - Nystatin swish for 2 weeks post transplant    #) BPs: good control, monitor     #) Diabetes  - BGs have been under Fair control  - monitor     #) Anemia - Hgb average of 11, monitor     #) Volume/Electrolytes:   - K acceptable  - Ca unacceptable, low, monitor   - Mg is low, needs repletion   - Phos acceptable      Assalam, MD    [facility]  Service Pager: [phone]

## 2018-11-14 NOTE — Progress Notes
OCCUPATIONAL THERAPY  ASSESSMENT/DISCHARGE NOTE      Name: Diana Cooley        MRN: [id number]          DOB: [birth year]          Age: 58 y.o.  Admission Date: 11/12/2018             LOS: 2 days      Mobility  Progressive Mobility Level: Walk in hallway  Distance Walked (feet): 150 ft  Level of Assistance: Stand by assistance  Assistive Device: (IV pole)  Time Tolerated: 11-30 minutes  Activity Limited By: No limitations    Subjective  Pertinent Dx per Physician: S/P DDRT 11/12/18 for ESRD 2/2 PKD with h/o failed transplant 2015  Precautions: Standard  Pain / Complaints: Patient agrees to participate in therapy  Comments: Pt supine in bed at entry and exit. All needs in reach. No epidural. Foley remains in place. Affect and tolerance of activity is much improved. She quickly reports that she is feeling much better today.    Objective  Psychosocial Status: Willing and Cooperative to Participate  Seen with PT this date due to time constraints and both disciplines planning to attempt at the same time.    Home Living  Type of Home: House  Home Layout: Two Level  Financial risk analyst / Tub: Tub/Shower Unit  Bathroom Toilet: Standard  Comment: Has one flight of stairs to manage within the home. 3-5 to enter.    Prior Function  Level Of Independence: Independent with ADLs and functional transfers;Independent with homemaking w/ ambulation  Lives With: Spouse  Receives Help From: None Needed  Homemaking Tasks: Cleaning;Shopping;Meal Prep;Laundry(Indep)  Other Function Comments: Reports spending time at home alot due to COVID. She has been sewing masks at home for others.     ADL's  Eating Assist: Independent  Grooming Assist: Stand By Assist  UE Dressing Assist: Independent  Toileting Assist: Total Assist(foley. Has been up to the bathroom for BM)   Did not feel AE was needed this date. Her spouse is able to assist with LB dressing- socks/shoes if needed.     ADL Mobility  Bed Mobility: Supine to Sit: Minimal assist(handhold) Bed Mobility: Sit to Supine: Minimal assist(CGA for line management/assist for pt's comfort. )  Transfer Type: Sit to stand  Transfer: Assistance Level: Minimal assist(handhold given bed mechanics and pt's height.)  Gait Distance: 150 feet  Gait: Assistance Level: Standby assist  Gait: Assistive Device: IV pole    Should pt need a device for mobility once she no longer has IV pole, suggested handhold assist vs. offered that we could order a RW if needed, though I do not anticipate that will be necessary. She is progressing nicely.    Activity Tolerance  Endurance: 4/5 Tolerates 30+ Minutes Exercise W/O Fatigue    Cognition  Overall Cognitive Status: WFL to Adequately Complete Self Care Tasks Safely    UE Strength / Tone  Overall Strength / Tone: WFL Able to Perform ADL Tasks    Education  Persons Educated: Patient  Barriers To Learning: None Noted  Teaching Methods: Verbal Instruction  Patient Response: Verbalized and Demo Understanding  Topics: Role of OT, Goals for Therapy  Goal Formulation: With Patient    Assessment  Prognosis: Good  Goal Formulation: Patient  No Skilled OT: No Acute OT Goals Identified    AM-PAC 6 Clicks Daily Activity Inpatient  Putting on and taking off regular lower body clothes?: A Little  Bathing (Including  washing, rinsing, drying): A Little  Toileting, which includes using toilet, bedpan, or urinal: Total  Putting on and taking off regular upper body clothing: None  Taking care of personal grooming such as brushing teeth: None  Eating meals?: None  Daily Activity Raw Score: 19  Standardized (t-scale) score: 40.22  CMS 0-100% Score: 42.8  CMS G Code Modifier: CK    Plan  OT Frequency: No Further Treatment    OT Discharge Recommendations  Recommendation: Home with intermittent supervision/assistance(Family assist as needed. Spouse available to assist. )      Therapist: Mathis Dad, OTR/L [id number]  Date: 11/14/2018

## 2018-11-14 NOTE — Telephone Encounter
Called patient to see if she had watched the Education video. She advised that the iPAD was not in her room and that she thought the Nurse may have taken it.  I advised that I would see if the nurse could bring it back in and that I would follow back up with her around 2:30. She verbalized agreement.    Called [address] to speak with BS RN Tori. She advised that she had placed the iPAD in the Nurse Coordinator's office.  She will bring it back to patient.

## 2018-11-14 NOTE — Telephone Encounter
Called patient back to see if she watched the video.  She advised that she watched most of it.  I asked that she please watch the rest tonight and I would follow up with her tomorrow on it. She agreed.  She also asked that I send the video to her husband at [email or web address] so he could watch this.      Sent her a MyChart message with the lab and clinic appointments for transplant next week.

## 2018-11-14 NOTE — Progress Notes
PHYSICAL THERAPY  PROGRESS NOTE        Name: Diana Cooley        MRN: [id number]          DOB: [birth year]          Age: 58 y.o.  Admission Date: 11/12/2018             LOS: 2 days        Mobility  Progressive Mobility Level: Walk in hallway  Distance Walked (feet): 150 ft  Level of Assistance: Stand by assistance  Assistive Device: (IV pole)  Time Tolerated: 11-30 minutes  Activity Limited By: No limitations    Subjective  Significant hospital events: S/p Deceased donor left kidney transplant on 9/3.   Mental / Cognitive Status: Alert;Oriented;Cooperative;Follows Commands  Persons Present: Occupational Therapist  Pain Location: Post-surgical  Pain Interventions: Patient agrees to participate in therapy  Comments: Pt reports she is feeling much better today   Comments: Woked with OT do to both showing up at the same time to work with pt.  Type of Home: House      Bed Mobility/Transfer  Bed Mobility: Rolling: Standby Assist;Verbal Cues;Head of Bed Elevated;Use of Rail;Requires Extra Time  Bed Mobility: Supine to Sit: Standby Assist;Verbal Cues;Use of Rail;Head of Bed Elevated;Requires Extra Time  Bed Mobility: Sit to Supine: Standby Assist;Verbal Cues;HOB Elevated;Use of Rail;Requires Extra Time;Safety Considerations  Transfer Type: Sit to/from Stand  Transfer: Assistance Level: To/From;Bed;Minimal Assist(CGA)  Transfer: Assistive Device: IV Pole  Transfers: Type Of Assistance: For Balance;For Safety Considerations  End Of Activity Status: In Bed;Nursing Notified;Instructed Patient to Request Assist with Mobility;Instructed Patient to Use Call Light      Gait  Gait Distance: 150 feet  Gait: Assistance Level: Standby Assist  Gait: Assistive Device: IV Pole  Gait: Descriptors: No balance loss  Comments: No c/o of dizziness this date      Education  Persons Educated: Patient  Patient Barriers To Learning: None Noted  Teaching Methods: Verbal Instruction  Patient Response: Verbalized Understanding Topics: Plan/Goals of PT Interventions;Importance of Increasing Activity;Recommend Continued Therapy;Safety Awareness;Up with Assist Only;Precautions    Assessment/Progress  Impaired Mobility Due To: Impaired Balance;Decreased Activity Tolerance  Assessment/Progress: Should Improve w/ Continued PT    AM-PAC 6 Clicks Basic Mobility Inpatient  Turning from your back to your side while in a flat bed without using bed rails: A Little  Moving from lying on your back to sitting on the side of a flatbed without using bedrails : A Lot  Moving to and from a bed to a chair (including a wheelchair): Total  Standing up from a chair using your arms (e.g. wheelchair, or bedside chair): Total  To walk in hospital room: Total  Climbing 3-5 steps with a railing: Total  Raw Score: 9  Standardized (T-scale) Score: 25.8  Basic Mobility CMS 0-100%: 77.59  CMS G Code Modifier for Basic Mobility: CL    Goals  Goal Formulation: With Patient  Time For Goal Achievement: 3 days  Patient Will Go Supine To/From Sit: w/ Stand By Assist  Patient Will Transfer Sit to Stand: w/ Stand By Assist  Patient Will Ambulate: 101-150 Feet, w/ Stand By Assist(with least restrictive device. )  Patient Will Go Up / Down Stairs: 3-5 Stairs, w/ Stand By Assist    Plan  Treatment Interventions: Mobility Training  Plan Frequency: 5-7 Days per Week  PT Plan for Next Visit: Continue to work on mobility as pt tolerates.    PT  Discharge Recommendations  Recommendation: Home with intermittent supervision/assistance    Therapist: Ernestine Mcmurray, PTA  Date: 11/14/2018

## 2018-11-14 NOTE — Progress Notes
STR text paged to notified of low UOP of 260mls this shift. Tracking # [id number]

## 2018-11-14 NOTE — Progress Notes
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 Ureteral stent placed.  Induction: Thymo 150mg , Solumedrol 500mg . Myfortic 720mg  BID  Immunosuppression: Thymo 150mg  9/4   Steroid taper   Myfortic 720mg  BID   Prograf 4.5mg  BID with goal of 10-15. Follow levels    Ppx  PJP: Bactrim to start on discharge  CMV: D+/R+   Valcyte to start on discharge  Stent: Keflex while inpatient  Fungal: Nystatin QID  DVT: Heparin ppx    FEN  NPO  IVF: 0.45+93meq sodium bicarb @100ml /hr  Bowel regimen not yet started      Plan:  Immunosuppression: Continue steroid taper and Myfortic 720mg  BID. No change to Prograf. Thymo 150mg  today.  *DDRT: Maintain foley, JP drain. Bowel regimen, oral pain regimen, lidoderm patch.   *FEN: SLIV; regular diet  *Dispo: Continue floor care, PT/OT. Discharge planning.       Will discuss with Dr. Caprice Kluver                   Subjective: Diana Cooley is a 58 y.o. patient. Pain controlled with medications; tolerating diet, denies n/v; ambulating with assistance    Objective:   Vitals:    11/14/18 0104 11/14/18 0505 11/14/18 0515 11/14/18 0856   BP: (!) 141/77  (!) 143/74 (!) 151/91   BP Source: Arm, Right Upper  Arm, Right Upper Arm, Right Upper   Pulse: 87  84 94   Temp: 36.7 ???C (98 ???F)  36.4 ???C (97.6 ???F) 37 ???C (98.6 ???F)   SpO2: 93%  94% 98%   Weight:  93 kg (205 lb)     Height:  157.5 cm (62.01)         Physical Exam  GEN: NAD.  HEENT: Nonicteric bilaterally. NEURO: AOx3. PERRL. MAE. Pain not currently controlled.   RESP: Unlabored. RA  CARDIO: SR on monitor.   ABD: Soft, round. Incision with original surgical dressing. JP with SS drainage  GU: foley with pink tinged UOP  INTEG: dry, warm    Labs/Radiology  Pertinent Labs/radiology results reviewed.    Trilby Leaver, MD   Pager [phone]

## 2018-11-15 MED ORDER — SODIUM CHLORIDE 0.9 % IV SOLP
1000 mL | INTRAVENOUS | 0 refills | Status: CP
Start: 2018-11-15 — End: ?
  Administered 2018-11-15: 16:00:00 1000 mL via INTRAVENOUS

## 2018-11-15 MED ORDER — TACROLIMUS 1 MG PO CAP
4 mg | Freq: Two times a day (BID) | ORAL | 0 refills | Status: DC
Start: 2018-11-15 — End: 2018-11-15
  Administered 2018-11-15: 16:00:00 4 mg via ORAL

## 2018-11-15 MED ORDER — TACROLIMUS 1 MG PO CAP
3 mg | Freq: Every day | ORAL | 0 refills | Status: DC
Start: 2018-11-15 — End: 2018-11-17
  Administered 2018-11-15 – 2018-11-16 (×2): 3 mg via ORAL

## 2018-11-15 MED ORDER — TACROLIMUS 1 MG PO CAP
4 mg | Freq: Every day | ORAL | 0 refills | Status: DC
Start: 2018-11-15 — End: 2018-11-17
  Administered 2018-11-16 – 2018-11-17 (×2): 4 mg via ORAL

## 2018-11-15 NOTE — Progress Notes
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 Ureteral stent placed.  Induction: Thymo 150mg , Solumedrol 500mg . Myfortic 720mg  BID  Immunosuppression: Thymo 150mg  9/4   Steroid taper   Myfortic 720mg  BID   Prograf 4.5mg  BID with goal of 10-15. Follow levels    Ppx  PJP: Bactrim to start on discharge  CMV: D+/R+   Valcyte to start on discharge  Stent: Keflex while inpatient  Fungal: Nystatin QID  DVT: Heparin ppx    FEN  NPO  IVF: 0.45+15meq sodium bicarb @100ml /hr  Bowel regimen not yet started      Plan:  Immunosuppression: Continue steroid taper and Myfortic 720mg  BID. Prograf held. Thymo 150mg  yesterday.  *DDRT: Maintain foley, JP drain. Bowel regimen, oral pain regimen, lidoderm patch.   *FEN: SLIV; regular diet  *Dispo: Continue floor care, PT/OT. Discharge planning.       Will discuss with Dr. Caprice Kluver                   Subjective: Diana Cooley is a 58 y.o. patient. Pain controlled with medications; tolerating diet, denies n/v; ambulating with assistance    Objective:   Vitals:    11/15/18 0122 11/15/18 0405 11/15/18 0614 11/15/18 0857   BP: (!) 144/85  (!) 142/76 115/68   BP Source: Arm, Right Upper  Arm, Right Upper Arm, Right Upper   Pulse: 83 77 82 88   Temp: 36.8 ???C (98.3 ???F)  36.6 ???C (97.8 ???F) 36.6 ???C (97.9 ???F)   SpO2: 98%  98% 98%   Weight:   93.4 kg (205 lb 12.8 oz)    Height:         UOP 785 /24 h  JP 261 /24h    Physical Exam  GEN: NAD. HEENT: Nonicteric bilaterally.  NEURO: AOx3. PERRL. MAE. Pain not currently controlled.   RESP: Unlabored. RA  CARDIO: SR on monitor.   ABD: Soft, round. Incision with original surgical dressing. JP with SS drainage  GU: foley with pink tinged UOP  INTEG: dry, warm    Labs/Radiology  Pertinent Labs/radiology results reviewed.    Jerilynn Som   Pager [phone]

## 2018-11-15 NOTE — Case Management (ED)
Case Management Progress Note    NAME:Diana Cooley                          MRN: [id number]              DOB:[birth year]          AGE: 58 y.o.  ADMISSION DATE: 11/12/2018             DAYS ADMITTED: LOS: 3 days      Todays Date: 11/15/2018    Plan  D/C Planning Phase    Interventions  ? Support      ? Info or Referral      ? Discharge Planning    Paged team, [phone], pt is not medically stable to d/c at this time. No estimated d/c date given.   ? Medication Needs      ? Financial      ? Legal      ? Other        Disposition  ? Expected Discharge Date    Expected Discharge Date: 11/15/18  Expected Discharge Time: 1200  ? Transportation   Does the patient need discharge transport arranged?: No  Transportation Name, Phone and Availability #1: Husband; Remo Lipps  ? Next Level of Care (Acute Psych discharges only)      ? Discharge Disposition                                          Durable Medical Equipment      No service has been selected for the patient.      [facility]      No service has been selected for the patient.      [facility]      No service has been selected for the patient.      [facility]      No service has been selected for the patient.        Ardeen Jourdain, [facility] Consult Team and Case Management  P. [phone]

## 2018-11-15 NOTE — Telephone Encounter
Called patient. Discussed discharge teaching including wound care, signs and symptoms of wound infection, vital sign monitoring, when to page on call RN, expectation for clinic appointment and labs, ureteral stent removal and appointment, and Prograf trough.     Patient advised regarding ureteral stent appointment on 01/07/2019.     Pt's questions answered. Patient advised that her husband was not able to get the video to play on his phone. I advised that when he came into the hospital later today that they could ask for IPAD to watch the video or he could try on his computer as it was on the website.  Patient declined me calling husband to try to trouble shoot video as he was taking care of their daughter who had wisdom teeth out on Friday.

## 2018-11-15 NOTE — Progress Notes
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  Donor: DCD, Age: 59s, female, Creatinine Initial/Terminal. 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 Ambulating without lightheadedness.     Having diarrhea. Stopped bowel regimen. Poor fluid intake.       REVIEW OF SYSTEMS: Comprehensive 14-point ROS reviewed  Positives noted in HPI otherwise negative.    Allergies:   Allergies   Allergen Reactions   ??? Zofran [Ondansetron Hcl (Pf)] HYPOTENSION   ??? Shellfish Containing Products HIVES and SHORTNESS OF BREATH     Pulmonary and loss of consciousness    ??? Levaquin [Levofloxacin] NAUSEA AND VOMITING and HYPOTENSION     Problem List:  Patient Active Problem List    Diagnosis Date Noted   ??? GERD (gastroesophageal reflux disease) 11/12/2018   ??? Vitamin D deficiency 11/12/2018   ??? Obesity 11/12/2018   ??? Insomnia 11/12/2018   ??? Immunosuppression (HCC) 11/12/2018   ??? 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 Acute tubular necrosis (HCC) 05/06/2013   ??? Nausea and vomiting 04/28/2013   ??? Kidney transplanted 03/11/2013   ??? S/P bilateral salpingo-oophorectomy 01/11/2013   ??? Ovarian cyst 12/10/2012   ??? Abnormal cardiovascular stress test 07/15/2011   ??? Shellfish allergy 07/15/2011   ??? HTN (hypertension) 07/15/2011   ??? Abnormal stress test 07/15/2011 ??? Pre-transplant evaluation for ESRD (end stage renal disease) 07/15/2011   ??? 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 Hemodialysis patient (HCC) 07/03/2011   ??? Polycystic kidney disease 06/27/2011   ??? Hypertension 06/27/2011   ??? Hyperlipidemia 06/27/2011       Current Medications:    Current Facility-Administered Medications:   ???  acetaminophen (TYLENOL) tablet 650 mg, 650 mg, Oral, Q4H PRN, Trilby Leaver, MD, 650 mg at 11/13/18 0911  ???  belladonna/opium (B&O) 16.2/60 mg rectal suppository 1 suppository, 1 suppository, Rectal, Q6H PRN, Trilby Leaver, MD  ???  bisacodyL (DULCOLAX) rectal suppository 10 mg, 10 mg, Rectal, QDAY PRN, Brokaw, Stephanie L, APRN-NP  ???  [COMPLETED] ceFAZolin (ANCEF) IVP 500 mg, 500 mg, Intravenous, Q12H*, 500 mg at 11/14/18 0843 **FOLLOWED BY** cephalexin (KEFLEX) capsule 500 mg, 500 mg, Oral, Q12H*, Trilby Leaver, MD, 500 mg at 11/15/18 0841  ???  heparin (porcine) PF syringe 5,000 Units, 5,000 Units, Subcutaneous, Q8H, Trilby Leaver, MD  ???  lidocaine (LIDODERM) 5 % topical patch 1 patch, 1 patch, Topical, QDAY, Stopped at 11/15/18 0112 **AND** Verification of Patch Placement and Integrity - Lidocaine 5%, , Transdermal, BID, Brokaw, Stephanie L, APRN-NP  ???  [COMPLETED] methylPREDNISolone (SOLU-MEDROL PF) injection 120 mg, 120 mg, Intravenous, QDAY, 120 mg at 11/13/18 1250 **FOLLOWED BY** [COMPLETED] methylPREDNISolone (SOLU-MEDROL PF) injection 80 mg, 80 mg, Intravenous, QDAY, 80 mg at 11/14/18 1244 **FOLLOWED BY** methylPREDNISolone (SOLU-Medrol)  injection 40 mg, 40 mg, Intravenous, QDAY **FOLLOWED BY** [START ON 11/16/2018] predniSONE (DELTASONE) tablet 30 mg, 30 mg, Oral, QDAY **FOLLOWED BY** [START ON 11/17/2018] predniSONE (DELTASONE) tablet 20 mg, 20 mg, Oral, QDAY, Trilby Leaver, MD  ???  mycophenolate DR (MYFORTIC) tablet 720 mg, 720 mg, Oral, BID, Trilby Leaver, MD, 720 mg at 11/15/18 0841  ???  nystatin (MYCOSTATIN) oral suspension 500,000 Units, 500,000 Units, Swish & Swallow, QID, Trilby Leaver, MD, 500,000 Units at 11/15/18 [phone]  ???  pantoprazole DR (PROTONIX) tablet 40 mg, 40 mg, Oral, QDAY(21), Brokaw, Stephanie L, APRN-NP, 40 mg at 11/14/18 2039  ???  polyethylene glycol 3350 (MIRALAX) packet 17 g, 1 packet, Oral, QDAY, Brokaw, Stephanie L, APRN-NP, 17 g at 11/13/18 0859  ???  prochlorperazine (COMPAZINE) injection 10 mg, 10 mg, Intravenous, Q6H PRN, Trilby Leaver, MD, 10 mg at 11/13/18 1005  ???  senna/docusate (SENOKOT-S) tablet 2 tablet, 2 tablet, Oral, BID, Brokaw, Stephanie L, APRN-NP, Stopped at 11/14/18 2100  ???  tacrolimus (PROGRAF) capsule 3 mg, 3 mg, Oral, QDAY(18), Seth Bake, MD  ???  [START ON 11/16/2018] tacrolimus (PROGRAF) capsule 4 mg, 4 mg, Oral, QDAY(06), Seth Bake, MD  ???  traMADoL (ULTRAM) tablet 50 mg, 50 mg, Oral, Q6H PRN, Brokaw, Stephanie L, APRN-NP  ???  [START ON 11/17/2018] valGANciclovir (VALCYTE) tablet 450 mg, 450 mg, Oral, Once per day on Tue Sat, Provider, Pharmacy  ???  venlafaxine (EFFEXOR) tablet 37.5 mg, 37.5 mg, Oral, QDAY, Brokaw, Stephanie L, APRN-NP, 37.5 mg at 11/15/18 0841    Physical Exam:  BP 115/68 (BP Source: Arm, Right Upper)  - Pulse 88  - Temp 36.6 ???C (97.9 ???F)  - Ht 157.5 cm (62.01)  - Wt 93.4 kg (205 lb 12.8 oz)  - LMP 03/11/2010 Comment: Bilat. Tubes & Overies - SpO2 98%  - BMI 37.63 kg/m???     Intake/Output Summary (Last 24 hours) at 11/15/2018 1304  Last data filed at 11/15/2018 0906  Gross per 24 hour   Intake 1620 ml   Output 1105 ml   Net 515 ml     General: In NAD; A&Ox3  Skin: Warm, dry, no signs of rash   Neck: Supple   CV: Regular, regular rate, normal S1/S2, no murmurs  Lungs: CTA bilaterally in posterior fields  Abd: Grossly normal without rebound, guarding, masses, or bruits   Transplant: LLQ, No overlying bruit, tender, incision dressing in place - clean, dry, and intact, + Drain   Ext: No edema  Neuro: No focal deficits   Psych: Affect appropriate  Dialysis Access: [facility]     Laboratory studies:     CMP: CMP Latest Ref Rng & Units 11/15/2018 11/14/2018 11/13/2018 11/12/2018 11/12/2018   NA 137 - 147 MMOL/L 137 137 135(L) 137 137   K 3.5 - 5.1 MMOL/L 4.0 4.3 4.5 4.4 4.0   CL 98 - 110 MMOL/L 103 102 100 101 103   CO2 21 - 30 MMOL/L 21 19(L) 22 22 25    GAP 3 - 12 13(H) 16(H) 13(H) 14(H) 9   BUN 7 - 25 MG/DL 84(O) 96(E) 95(M) 84(X) 37(H)   CR 0.4 - 1.00 MG/DL 3.24(M) 0.10(U) 7.25(D) 5.15(H) 4.99(H)   GLUX 70 - 100 MG/DL 664(Q) 034(V) 425(Z) 563(O) 166(H)   CA 8.5 - 10.6 MG/DL 7.6(L) 7.6(L) 7.2(L) 7.2(L) 7.3(L)   TP 6.0 - 8.0 G/DL 5.2(L) 5.6(L) 5.0(L) - -   ALB 3.5 - 5.0 G/DL 3.0(L) 3.2(L) 3.0(L) - -  ALKP 25 - 110 U/L 34 42 37 - -   ALT 7 - 56 U/L 4(L) 9 15 - -   TBILI 0.3 - 1.2 MG/DL 0.5 0.5 0.6 - -   GFR >16 mL/min 9(L) 8(L) 9(L) 9(L) 9(L)   GFRAA >60 mL/min 10(L) 10(L) 10(L) 10(L) 11(L)     Hemoglobin A1C (%)   Date Value   11/12/2018 5.3   05/07/2017 5.4   02/12/2016 5.1     PTH Hormone (PG/ML)   Date Value   11/12/2018 62.9   03/25/2018 559.8 (H)     Lipase   Date Value Ref Range Status   04/28/2013 19 11 - 82 U/L Final   09/25/2012 27 11 - 82 U/L Final     Amylase   Date Value Ref Range Status   04/28/2013 39 24 - 100 U/L Final   09/25/2012 61 24 - 100 U/L Final     BK Virus Plasma Quant (no units)   Date Value   07/01/2013 Negative for BK Virus   06/03/2013 Negative for BK Virus   05/19/2013 Negative for BK Virus   04/28/2013 Negative for BK Virus     CMV DNA Quant PCR (no units)   Date Value   11/12/2013 Positive for CMV   10/05/2013 Positive for CMV   09/22/2013 Negative for CMV   09/14/2013 Negative for CMV   09/08/2013 Negative for CMV     No results found for: IUMLBLD  No results found for: COPIES  EBV DNA, Quant. (no units)   Date Value   04/28/2013 Negative for EBV       TACROLIMUS LEVEL:  Tacrolimus Immunoassay (NG/ML)   Date Value   11/15/2018 13.4   11/14/2018 25.0 (HH)   02/12/2016 9.5   12/09/2014 7.8   01/17/2014 6.4   12/31/2013 7.9   11/12/2013 8.4   10/05/2013 7.9       CBC with Diff: CBC with Diff Latest Ref Rng & Units 11/15/2018 11/14/2018   WBC 4.5 - 11.0 K/UL 4.1(L) 10.8   RBC 4.0 - 5.0 M/UL 3.16(L) 3.60(L)   HGB 12.0 - 15.0 GM/DL 10.0(L) 11.5(L)   HCT 36 - 45 % 29.3(L) 33.2(L)   MCV 80 - 100 FL 92.6 92.1   MCH 26 - 34 PG 31.6 32.0   MCHC 32.0 - 36.0 G/DL 10.9 60.4   RDW 11 - 15 % 14.3 14.7   PLT 150 - 400 K/UL 88(L) 121(L)   MPV 7 - 11 FL 8.9 8.4   NEUT 41 - 77 % 93(H) 96(H)   ANC 1.8 - 7.0 K/UL 3.86 10.44(H)   LYMA 24 - 44 % 1(L) 1(L)   ALYM 1.0 - 4.8 K/UL 0.03(L) 0.07(L)   MONA 4 - 12 % 5 3(L)   AMONO 0 - 0.80 K/UL 0.19 0.28   EOSA 0 - 5 % 0 0   AEOS 0 - 0.45 K/UL 0.00 0.00   BASA 0 - 2 % 1 0   ABAS 0 - 0.20 K/UL 0.04 0.03     Lab Results   Component Value Date/Time    IRON 85 03/25/2013 07:41 AM    TIBC 249 (L) 03/25/2013 07:41 AM    PSAT 34 03/25/2013 07:41 AM    FERRITIN 263 (H) 03/25/2013 07:41 AM       Urinalysis:  Lab Results   Component Value Date/Time    UCOLOR YELLOW 11/12/2018 05:20 AM    TURBID CLEAR 11/12/2018 05:20 AM  USPGR 1.008 11/12/2018 05:20 AM    UPH 5.0 11/12/2018 05:20 AM    UPROTEIN NEG 11/12/2018 05:20 AM    UAGLU NEG 11/12/2018 05:20 AM    UKET NEG 11/12/2018 05:20 AM    UBILE NEG 11/12/2018 05:20 AM    UBLD NEG 11/12/2018 05:20 AM    UROB NORMAL 11/12/2018 05:20 AM     Protein/CR ratio (no units)   Date Value   07/01/2013 0.4       Imaging:  Results for orders placed during the hospital encounter of 05/07/17   CT ABD/PELV WO CONTRAST    Impression 1. 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 Some of these cystic renal lesions demonstrate intermediate density, likely representing  intracystic hemorrhage. Evaluation for renal neoplasm is limited in the absence of IV contrast.  2. No abdominal/pelvic inflammatory mass, ascites, or bowel obstruction.  3. Development of a irregular soft tissue nodule in the inferior left breast. Recommend mammography for further evaluation. Approved by Trey Sailors, M.D. on 05/07/2017 2:26 PM    By my electronic signature, I attest that I have personally reviewed the images for this examination and formulated the interpretations and opinions expressed in this report       Finalized by Leisa Lenz, M.D. on 05/07/2017 2:31 PM. Dictated by Trey Sailors, M.D. on 05/07/2017 2:00 PM.       Results for orders placed during the hospital encounter of 11/12/18   CHEST 2 VIEWS    Impression Minor zones of atelectasis in the left costophrenic angle.    Otherwise stable chest radiograph demonstrating no acute cardiopulmonary abnormalities.       Finalized by Darel Hong, M.D. on 11/12/2018 7:16 AM. Dictated by Darel Hong, M.D. on 11/12/2018 7:15 AM.         Patient Active Problem List    Diagnosis Date Noted   ??? GERD (gastroesophageal reflux disease) 11/12/2018   ??? Vitamin D deficiency 11/12/2018   ??? Obesity 11/12/2018   ??? Insomnia 11/12/2018   ??? Immunosuppression (HCC) 11/12/2018   ??? 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 Acute tubular necrosis (HCC) 05/06/2013   ??? Nausea and vomiting 04/28/2013   ??? Kidney transplanted 03/11/2013   ??? S/P bilateral salpingo-oophorectomy 01/11/2013   ??? Ovarian cyst 12/10/2012   ??? Abnormal cardiovascular stress test 07/15/2011   ??? Shellfish allergy 07/15/2011   ??? HTN (hypertension) 07/15/2011   ??? Abnormal stress test 07/15/2011   ??? Pre-transplant evaluation for ESRD (end stage renal disease) 07/15/2011   ??? 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 Hemodialysis patient (HCC) 07/03/2011   ??? Polycystic kidney disease 06/27/2011   ??? Hypertension 06/27/2011   ??? Hyperlipidemia 06/27/2011       Cardiovascular Screening  Echocardiogram: Date jan/2020 - EF 50-55%, No WMA, No valvular disease which could compromise the transplanted renal blood flow  NM Stress Test: Date Jan/2020, Negative for inducible reversible ischemia and is considered low risk Left Heart Catheterization: Date 2013: No angiographic evidence of significant obstructive coronary artery disease. Moderately depressed left anterior descending artery with an EF of 35 percent to 40 percent.    Cancer Screening  Colonoscopy: Date 2013 - 3 polyps tubular adenomas   Mammogram: Date sep/2019 - normal  PAP: Date apr/ 2019 - normal    Assessment and Plan:  Ms. 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  24 hour level 13.   Resume tac 4/3.   - MPA 720mg  BID. Continue steroid taper.    #) Deceased donor renal transplant: bl Cr tbd mg/dL, bl UPCR tbc gm/gm  - Renal graft function: acceptable uop, scr slight improvement.  - no indication for HD today   - Keep MAP>65  Hemolysis markers fine.    #) ID Proph  - CMV Donor(+)/ Recipient(+) to treat with Valcyte for 3 months post transplant  - PJP Prophylax: To treat with Bactrim/Septra for 1 year post transplant  - Nystatin swish for 2 weeks post transplant  valcyte twice weekly    #) BPs:  At goal.    #) Diabetes  - BGs have been under Fair control  - monitor     #) Anemia - Hgb average of 10, monitor     #) Volume/Electrolytes:   - K acceptable  - Ca unacceptable, low, monitor   - Mg is low, needs repletion   - Phos acceptable     Angelita Ingles, MD    [facility]  Service Pager: [phone]

## 2018-11-16 MED ORDER — POLYETHYLENE GLYCOL 3350 17 GRAM/DOSE PO POWD
17 g | Freq: Every day | ORAL | 1 refills | 30.00000 days | Status: DC | PRN
Start: 2018-11-16 — End: 2018-11-19

## 2018-11-16 MED ORDER — MYCOPHENOLATE SODIUM 360 MG PO TBEC
360 mg | Freq: Four times a day (QID) | ORAL | 0 refills | Status: DC
Start: 2018-11-16 — End: 2018-11-17
  Administered 2018-11-16 – 2018-11-17 (×4): 360 mg via ORAL

## 2018-11-16 MED ORDER — PREDNISONE 5 MG PO TAB
ORAL_TABLET | Freq: Every day | 1 refills | Status: AC
Start: 2018-11-16 — End: ?
  Filled 2018-11-17: qty 120, 37d supply, fill #1

## 2018-11-16 MED ORDER — ACETAMINOPHEN 325 MG PO TAB
650 mg | ORAL_TABLET | ORAL | 1 refills | Status: CN | PRN
Start: 2018-11-16 — End: ?

## 2018-11-16 MED ORDER — SULFAMETHOXAZOLE-TRIMETHOPRIM 400-80 MG PO TAB
1 | ORAL_TABLET | Freq: Two times a day (BID) | ORAL | 11 refills | Status: DC
Start: 2018-11-16 — End: 2018-11-17
  Filled 2018-11-17: qty 30, 15d supply

## 2018-11-16 MED ORDER — NYSTATIN 100,000 UNIT/ML PO SUSP
500000 [IU] | Freq: Four times a day (QID) | ORAL | 0 refills | 12.00000 days | Status: AC
Start: 2018-11-16 — End: ?
  Filled 2018-11-17: qty 200, 10d supply, fill #1

## 2018-11-16 NOTE — Progress Notes
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  Donor: DCD, Age: 60s, female, Creatinine Initial/Terminal. 1.36/1.04  KDPI: 51%, Left kidney GS 2/151 Mild focal fibrosis  Left kidney into LLQ  cPRA:??? 0% HLA MM 2/2/0  Match: VXM Negative, PXM Negative  CMV: D+/R+  Induction: thymoglobulin  Maintenance Immunosuppression: Triple drug therapy  Baseline Cr: TBD  Stent removal date: TBD  Post OP: Slow graft function  Surgeon: Armanda Magic    Subjective:  Diana Cooley is feeling overwhelmed a emotional today. She is afraid of getting the [facility] out in case the kidney dose not function. She was able to calm down. She denies any major symptoms, other than feeling weal, though has gone for a few laps around the floor with no issues. Denies any nausea, vomiting, but is having diarrhea. She denies any shortness of breath, LE edema. She denies any chest pain.    REVIEW OF SYSTEMS: Comprehensive 14-point ROS reviewed  Positives noted in HPI otherwise negative.    Allergies:   Allergies   Allergen Reactions   ??? Zofran [Ondansetron Hcl (Pf)] HYPOTENSION   ??? Shellfish Containing Products HIVES and SHORTNESS OF BREATH     Pulmonary and loss of consciousness    ??? Levaquin [Levofloxacin] NAUSEA AND VOMITING and HYPOTENSION     Problem List:  Patient Active Problem List    Diagnosis Date Noted   ??? GERD (gastroesophageal reflux disease) 11/12/2018   ??? Vitamin D deficiency 11/12/2018   ??? Obesity 11/12/2018   ??? Insomnia 11/12/2018   ??? Immunosuppression (HCC) 11/12/2018   ??? 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 Acute tubular necrosis (HCC) 05/06/2013   ??? Nausea and vomiting 04/28/2013   ??? Kidney transplanted 03/11/2013   ??? S/P bilateral salpingo-oophorectomy 01/11/2013 ??? Ovarian cyst 12/10/2012   ??? Abnormal cardiovascular stress test 07/15/2011   ??? Shellfish allergy 07/15/2011   ??? HTN (hypertension) 07/15/2011   ??? Abnormal stress test 07/15/2011   ??? Pre-transplant evaluation for ESRD (end stage renal disease) 07/15/2011   ??? 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 Hemodialysis patient (HCC) 07/03/2011   ??? Polycystic kidney disease 06/27/2011   ??? Hypertension 06/27/2011   ??? Hyperlipidemia 06/27/2011     Current Medications:    Current Facility-Administered Medications:   ???  acetaminophen (TYLENOL) tablet 650 mg, 650 mg, Oral, Q4H PRN, Trilby Leaver, MD, 650 mg at 11/16/18 1105  ???  belladonna/opium (B&O) 16.2/60 mg rectal suppository 1 suppository, 1 suppository, Rectal, Q6H PRN, Trilby Leaver, MD  ???  bisacodyL (DULCOLAX) rectal suppository 10 mg, 10 mg, Rectal, QDAY PRN, Brokaw, Stephanie L, APRN-NP  ???  [COMPLETED] ceFAZolin (ANCEF) IVP 500 mg, 500 mg, Intravenous, Q12H*, 500 mg at 11/14/18 0843 **FOLLOWED BY** cephalexin (KEFLEX) capsule 500 mg, 500 mg, Oral, Q12H*, Trilby Leaver, MD, 500 mg at 11/16/18 3086  ???  heparin (porcine) PF syringe 5,000 Units, 5,000 Units, Subcutaneous, Q8H, Trilby Leaver, MD  ???  lidocaine (LIDODERM) 5 % topical patch 1 patch, 1 patch, Topical, QDAY, 1 patch at 11/16/18 1344 **AND** Verification of Patch Placement and Integrity - Lidocaine 5%, , Transdermal, BID, Brokaw, Stephanie L, APRN-NP  ???  mycophenolate DR (MYFORTIC) tablet 360  mg, 360 mg, Oral, QID, ,   ???  nystatin (MYCOSTATIN) oral suspension 500,000 Units, 500,000 Units, Swish & Swallow, QID, Trilby Leaver, MD, 500,000 Units at 11/16/18 1344  ???  pantoprazole DR (PROTONIX) tablet 40 mg, 40 mg, Oral, QDAY(21), Brokaw, Stephanie L, APRN-NP, 40 mg at 11/15/18 2147  ???  polyethylene glycol 3350 (MIRALAX) packet 17 g, 1 packet, Oral, QDAY, Brokaw, Stephanie L, APRN-NP, 17 g at 11/13/18 0859  ???  [COMPLETED] methylPREDNISolone (SOLU-MEDROL PF) injection 120 mg, 120 mg, Intravenous, QDAY, 120 mg at 11/13/18 1250 **FOLLOWED BY** [COMPLETED] methylPREDNISolone (SOLU-MEDROL PF) injection 80 mg, 80 mg, Intravenous, QDAY, 80 mg at 11/14/18 1244 **FOLLOWED BY** [COMPLETED] methylPREDNISolone (SOLU-Medrol) injection 40 mg, 40 mg, Intravenous, QDAY, 40 mg at 11/15/18 1312 **FOLLOWED BY** [COMPLETED] predniSONE (DELTASONE) tablet 30 mg, 30 mg, Oral, QDAY, 30 mg at 11/16/18 0905 **FOLLOWED BY** [START ON 11/17/2018] predniSONE (DELTASONE) tablet 20 mg, 20 mg, Oral, QDAY, Nix, Vincenza Hews, MD  ???  prochlorperazine (COMPAZINE) injection 10 mg, 10 mg, Intravenous, Q6H PRN, Trilby Leaver, MD, 10 mg at 11/13/18 1005  ???  senna/docusate (SENOKOT-S) tablet 2 tablet, 2 tablet, Oral, BID, Brokaw, Stephanie L, APRN-NP, Stopped at 11/14/18 2100  ???  tacrolimus (PROGRAF) capsule 3 mg, 3 mg, Oral, QDAY(18), Seth Bake, MD, 3 mg at 11/15/18 1755  ???  tacrolimus (PROGRAF) capsule 4 mg, 4 mg, Oral, QDAY(06), Seth Bake, MD, 4 mg at 11/16/18 0551  ???  traMADoL (ULTRAM) tablet 50 mg, 50 mg, Oral, Q6H PRN, Brokaw, Stephanie L, APRN-NP  ???  [START ON 11/17/2018] valGANciclovir (VALCYTE) tablet 450 mg, 450 mg, Oral, Once per day on Tue Sat, Provider, Pharmacy  ???  venlafaxine (EFFEXOR) tablet 37.5 mg, 37.5 mg, Oral, QDAY, Brokaw, Stephanie L, APRN-NP, 37.5 mg at 11/16/18 0903    Physical Exam:  BP (!) 156/78 (BP Source: Arm, Right Upper)  - Pulse 84  - Temp 36.8 ???C (98.3 ???F)  - Ht 157.5 cm (62.01)  - Wt 93.9 kg (207 lb)  - LMP 03/11/2010 Comment: Bilat. Tubes & Overies - SpO2 99%  - BMI 37.85 kg/m???     Intake/Output Summary (Last 24 hours) at 11/16/2018 1502  Last data filed at 11/16/2018 1342  Gross per 24 hour   Intake 3560 ml   Output 1445 ml   Net 2115 ml     General: In NAD; A&Ox3  Skin: Warm, dry, no signs of rash   Neck: Supple   CV: Regular, regular rate, normal S1/S2, no murmurs  Lungs: CTA bilaterally in posterior fields  Abd: Grossly normal without rebound, guarding, masses, or bruits Transplant: No overlying bruit, nontender, incision dressing in place - clean, dry, and intact closed by staples (s)  Ext: No edema  Neuro: No focal deficits   Psych: Affect appropriate      Laboratory studies:   CMP:  CMP Latest Ref Rng & Units 11/16/2018 11/15/2018 11/14/2018 11/13/2018 11/12/2018   NA 137 - 147 MMOL/L 137 137 137 135(L) 137   K 3.5 - 5.1 MMOL/L 3.6 4.0 4.3 4.5 4.4   CL 98 - 110 MMOL/L 104 103 102 100 101   CO2 21 - 30 MMOL/L 19(L) 21 19(L) 22 22   GAP 3 - 12 14(H) 13(H) 16(H) 13(H) 14(H)   BUN 7 - 25 MG/DL 91(Y) 78(G) 95(A) 21(H) 38(H)   CR 0.4 - 1.00 MG/DL 0.86(V) 7.84(O) 9.62(X) 5.21(H) 5.15(H)   GLUX 70 - 100 MG/DL 528(U) 132(G) 401(U) 272(Z) 222(H)   CA 8.5 -  10.6 MG/DL 7.7(L) 7.6(L) 7.6(L) 7.2(L) 7.2(L)   TP 6.0 - 8.0 G/DL 5.2(L) 5.2(L) 5.6(L) 5.0(L) -   ALB 3.5 - 5.0 G/DL 3.1(L) 3.0(L) 3.2(L) 3.0(L) -   ALKP 25 - 110 U/L 35 34 42 37 -   ALT 7 - 56 U/L <3(L) 4(L) 9 15 -   TBILI 0.3 - 1.2 MG/DL 0.6 0.5 0.5 0.6 -   GFR >60 mL/min 9(L) 9(L) 8(L) 9(L) 9(L)   GFRAA >60 mL/min 11(L) 10(L) 10(L) 10(L) 10(L)     Hemoglobin A1C (%)   Date Value   11/12/2018 5.3   05/07/2017 5.4   02/12/2016 5.1     PTH Hormone (PG/ML)   Date Value   11/12/2018 62.9   03/25/2018 559.8 (H)     BK Virus Plasma Quant (no units)   Date Value   07/01/2013 Negative for BK Virus   06/03/2013 Negative for BK Virus   05/19/2013 Negative for BK Virus   04/28/2013 Negative for BK Virus     CMV DNA Quant PCR (no units)   Date Value   11/12/2013 Positive for CMV   10/05/2013 Positive for CMV   09/22/2013 Negative for CMV   09/14/2013 Negative for CMV   09/08/2013 Negative for CMV     EBV DNA, Quant. (no units)   Date Value   04/28/2013 Negative for EBV     TACROLIMUS LEVEL:  Tacrolimus Immunoassay (NG/ML)   Date Value   11/16/2018 12.4   11/15/2018 13.4   11/14/2018 25.0 (HH)   02/12/2016 9.5   12/09/2014 7.8   01/17/2014 6.4   12/31/2013 7.9   11/12/2013 8.4     CBC with Diff:  CBC with Diff Latest Ref Rng & Units 11/16/2018 11/15/2018 WBC 4.5 - 11.0 K/UL 3.1(L) 4.1(L)   RBC 4.0 - 5.0 M/UL 2.94(L) 3.16(L)   HGB 12.0 - 15.0 GM/DL 1.6(X) 10.0(L)   HCT 36 - 45 % 27.2(L) 29.3(L)   MCV 80 - 100 FL 92.5 92.6   MCH 26 - 34 PG 31.6 31.6   MCHC 32.0 - 36.0 G/DL 09.6 04.5   RDW 11 - 15 % 14.7 14.3   PLT 150 - 400 K/UL 85(L) 88(L)   MPV 7 - 11 FL 8.7 8.9   NEUT 41 - 77 % 95(H) 93(H)   ANC 1.8 - 7.0 K/UL 2.98 3.86   LYMA 24 - 44 % 1(L) 1(L)   ALYM 1.0 - 4.8 K/UL 0.02(L) 0.03(L)   MONA 4 - 12 % 3(L) 5   AMONO 0 - 0.80 K/UL 0.08 0.19   EOSA 0 - 5 % 0 0   AEOS 0 - 0.45 K/UL 0.01 0.00   BASA 0 - 2 % 1 1   ABAS 0 - 0.20 K/UL 0.01 0.04     Lab Results   Component Value Date/Time    IRON 112 11/16/2018 05:44 AM    TIBC 228 (L) 11/16/2018 05:44 AM    PSAT 49 (H) 11/16/2018 05:44 AM    FERRITIN 148 11/16/2018 05:44 AM    FERRITIN 263 (H) 03/25/2013 07:41 AM     Urinalysis:  Lab Results   Component Value Date/Time    UCOLOR YELLOW 11/12/2018 05:20 AM    TURBID CLEAR 11/12/2018 05:20 AM    USPGR 1.008 11/12/2018 05:20 AM    UPH 5.0 11/12/2018 05:20 AM    UPROTEIN NEG 11/12/2018 05:20 AM    UAGLU NEG 11/12/2018 05:20 AM    UKET NEG 11/12/2018 05:20  AM    UBILE NEG 11/12/2018 05:20 AM    UBLD NEG 11/12/2018 05:20 AM    UROB NORMAL 11/12/2018 05:20 AM     Protein/CR ratio (no units)   Date Value   07/01/2013 0.4     Imaging:  Results for orders placed during the hospital encounter of 05/07/17   CT ABD/PELV WO CONTRAST    Impression 1. 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 Some of these cystic renal lesions demonstrate intermediate density, likely representing  intracystic hemorrhage. Evaluation for renal neoplasm is limited in the absence of IV contrast.  2. No abdominal/pelvic inflammatory mass, ascites, or bowel obstruction.  3. Development of a irregular soft tissue nodule in the inferior left breast. Recommend mammography for further evaluation. Approved by Trey Sailors, M.D. on 05/07/2017 2:26 PM    By my electronic signature, I attest that I have personally reviewed the images for this examination and formulated the interpretations and opinions expressed in this report       Finalized by Leisa Lenz, M.D. on 05/07/2017 2:31 PM. Dictated by Trey Sailors, M.D. on 05/07/2017 2:00 PM.       Results for orders placed during the hospital encounter of 11/12/18   CHEST 2 VIEWS    Impression Minor zones of atelectasis in the left costophrenic angle.    Otherwise stable chest radiograph demonstrating no acute cardiopulmonary abnormalities.       Finalized by Darel Hong, M.D. on 11/12/2018 7:16 AM. Dictated by Darel Hong, M.D. on 11/12/2018 7:15 AM.         Assessment and Plan:  Diana. 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    #) Deceased donor renal transplant: bl Cr TBD, bl UPCR TBD  - Cr steadily improving every day - today 4.8 from 5.5 immediate post-op  - UOP remains non-oliguric  - Encouraged good PO intake (is also having diarrhea, contributing to fluid losses)  - Patient hesitant to remove TDC, but recognizes the difficulty in arranging for this outpatient - will follow Cr and UOP trend and plan for removal of [facility] tomorrow if able    #) Immunosuppression  Induction: Thymoglobulin  On maintenance Triple drug therapy  - Currently on Prograf 4 mg in AM and 3 mg in PM  *Goal tacrolimus trough first 3 months after transplant is 12-17 ng/mL (MEIA) or 8-12 mcg/L (HPLC/LCMS), HPLC preferred metric.  *Level today 12.4 by MEIA, LCMS pending  *Will make no changes to dose  - Continue full dose MPA - but given diarrhea, will split into 360 mg PO 4 times a day  - Continue Prednisone taper to 10 mg PO daily    #) ID Proph  - CMV Donor(+)/ Recipient(+) to treat with Valcyte for 3 months post transplant  - PJP Prophylax: To treat with Bactrim/Septra for 1 year post transplant - Nystatin swish for 2 weeks post transplant    #) BPs: Good control  - Not on any anti-hypertensive medications    #) Glycemia - BGs have been under Fair control  - Last A1C was 5.3 on 11/12/2018  - Not on any medications    #) Anemia  - Hgb currently 9.3 g/dL  - Iron studies from 03/16/1094 with adequate stores  - Drop in both Plts and Hgb likely related to thymoglobulin  - Hapto normal on 9/6, along with  LDH  - Will monitor    #) Volume/Electrolytes:   - K acceptable  - Mg acceptable  - Ca acceptable  - PO4 acceptable         K[facility] and Pancreas Transplant Fellow  Service Pager: [phone]

## 2018-11-16 NOTE — Progress Notes
Transplant Surgery Progress Note    Patient Name: Diana Cooley  MRN: [id number]  Admit Date: 11/12/2018    Assessment:  Principal Problem:    ESRD (end stage renal disease) (HCC)  Active Problems:    Polycystic kidney disease    Hypertension    Hyperlipidemia    Hemodialysis patient (HCC)    Kidney transplanted    Adjustment reaction with anxiety and depression    GERD (gastroesophageal reflux disease)    Vitamin D deficiency    Obesity    Insomnia    Immunosuppression (HCC)      S/P DDRT 11/12/18 for ESRD 2/2 PKD with h/o failed transplant 2013/08/28  Left deceased donor kidney transplant into left. Ureteral stent placed.  Induction: Thymo 150mg , Solumedrol 500mg . Myfortic 720mg  BID  Immunosuppression: Thymo 150mg  9/4   Steroid taper   Myfortic 720mg  BID   Prograf 4/3mg  BID with goal of 10-15. Follow levels    Ppx  PJP: Bactrim to start on discharge  CMV: D+/R+   Valcyte to start on discharge  Stent: Keflex while inpatient  Fungal: Nystatin QID  DVT: Heparin ppx    FEN  NPO  IVF: 0.45+77meq sodium bicarb @100ml /hr  Bowel regimen not yet started      Plan:  Immunosuppression: Continue steroid taper and Myfortic 720mg  BID. Resume prograf 4/3. Thymo 150mg  yesterday.  *DDRT: Maintain foley, JP drain. Bowel regimen, oral pain regimen, lidoderm patch.   *FEN: SLIV; regular diet  *GU: Foley out today  *MSK: Out of bed, ambulate  *Dispo: Continue floor care, PT/OT. Discharge planning.       Discussed with Dr. Caprice Kluver                   Subjective: Diana Cooley is a 58 y.o. patient. Pain controlled with medications; tolerating diet, denies n/v; ambulating with assistance    Objective:   Vitals:    11/15/18 1720 11/15/18 2152 11/16/18 0107 11/16/18 0541   BP: 129/67 125/69 (!) 147/81 (!) 145/68   BP Source: Arm, Right Upper Arm, Right Upper Arm, Right Upper Arm, Right Upper   Pulse: 82 77 76 79   Temp: 36.8 ???C (98.3 ???F) 36.9 ???C (98.5 ???F) 36.3 ???C (97.4 ???F) 36.5 ???C (97.7 ???F)   SpO2: 99% 99% 100% 98%   Weight:    93.9 kg (207 lb) Height:         UOP 750 /24 h  JP 260 /24h    Results for orders placed or performed during the hospital encounter of 11/12/18 (from the past 48 hour(s))   COMPREHENSIVE METABOLIC PANEL    Collection Time: 11/15/18  6:20 AM   # # Low-High    Sodium 137 137 - 147 MMOL/L    Potassium 4.0 3.5 - 5.1 MMOL/L    Chloride 103 98 - 110 MMOL/L    Glucose 126 (H) 70 - 100 MG/DL    Blood Urea Nitrogen 71 (H) 7 - 25 MG/DL    Creatinine 4.54 (H) 0.4 - 1.00 MG/DL    Calcium 7.6 (L) 8.5 - 10.6 MG/DL    Total Protein 5.2 (L) 6.0 - 8.0 G/DL    Total Bilirubin 0.5 0.3 - 1.2 MG/DL    Albumin 3.0 (L) 3.5 - 5.0 G/DL    Alk Phosphatase 34 25 - 110 U/L    AST (SGOT) 14 7 - 40 U/L    CO2 21 21 - 30 MMOL/L    ALT (SGPT) 4 (L) 7 - 56  U/L    Anion Gap 13 (H) 3 - 12    eGFR Non African American 9 (L) >60 mL/min    eGFR African American 10 (L) >60 mL/min   MAGNESIUM    Collection Time: 11/15/18  6:20 AM   # # Low-High    Magnesium 2.1 1.6 - 2.6 mg/dL   PHOSPHORUS    Collection Time: 11/15/18  6:20 AM   # # Low-High    Phosphorus 5.3 (H) 2.0 - 4.5 MG/DL   TACROLIMUS IMMUNOASSAY (FK506)    Collection Time: 11/15/18  6:20 AM   # # Low-High    Tacrolimus Immunoassay 13.4 2 - 15 NG/ML   HAPTOGLOBIN    Collection Time: 11/15/18  6:20 AM   # # Low-High    Haptoglobin 112 16 - 200 MG/DL   LDH-LACTATE DEHYDROGENASE    Collection Time: 11/15/18  6:20 AM   # # Low-High    Lactate Dehydrogenase 391 (H) 100 - 210 U/L   CBC AND DIFF    Collection Time: 11/15/18 10:12 AM   # # Low-High    White Blood Cells 4.1 (L) 4.5 - 11.0 K/UL    RBC 3.16 (L) 4.0 - 5.0 M/UL    Hemoglobin 10.0 (L) 12.0 - 15.0 GM/DL    Hematocrit 16.1 (L) 36 - 45 %    MCV 92.6 80 - 100 FL    MCH 31.6 26 - 34 PG    MCHC 34.2 32.0 - 36.0 G/DL    RDW 09.6 11 - 15 %    Platelet Count 88 (L) 150 - 400 K/UL    MPV 8.9 7 - 11 FL    Neutrophils 93 (H) 41 - 77 %    Lymphocytes 1 (L) 24 - 44 %    Monocytes 5 4 - 12 %    Eosinophils 0 0 - 5 %    Basophils 1 0 - 2 % Absolute Neutrophil Count 3.86 1.8 - 7.0 K/UL    Absolute Lymph Count 0.03 (L) 1.0 - 4.8 K/UL    Absolute Monocyte Count 0.19 0 - 0.80 K/UL    Absolute Eosinophil Count 0.00 0 - 0.45 K/UL    Absolute Basophil Count 0.04 0 - 0.20 K/UL   CBC AND DIFF    Collection Time: 11/16/18  5:44 AM   # # Low-High    White Blood Cells 3.1 (L) 4.5 - 11.0 K/UL    RBC 2.94 (L) 4.0 - 5.0 M/UL    Hemoglobin 9.3 (L) 12.0 - 15.0 GM/DL    Hematocrit 04.5 (L) 36 - 45 %    MCV 92.5 80 - 100 FL    MCH 31.6 26 - 34 PG    MCHC 34.2 32.0 - 36.0 G/DL    RDW 40.9 11 - 15 %    Platelet Count 85 (L) 150 - 400 K/UL    MPV 8.7 7 - 11 FL    Neutrophils 95 (H) 41 - 77 %    Lymphocytes 1 (L) 24 - 44 %    Monocytes 3 (L) 4 - 12 %    Eosinophils 0 0 - 5 %    Basophils 1 0 - 2 %    Absolute Neutrophil Count 2.98 1.8 - 7.0 K/UL    Absolute Lymph Count 0.02 (L) 1.0 - 4.8 K/UL    Absolute Monocyte Count 0.08 0 - 0.80 K/UL    Absolute Eosinophil Count 0.01 0 - 0.45 K/UL    Absolute Basophil  Count 0.01 0 - 0.20 K/UL   COMPREHENSIVE METABOLIC PANEL    Collection Time: 11/16/18  5:44 AM   # # Low-High    Sodium 137 137 - 147 MMOL/L    Potassium 3.6 3.5 - 5.1 MMOL/L    Chloride 104 98 - 110 MMOL/L    Glucose 110 (H) 70 - 100 MG/DL    Blood Urea Nitrogen 70 (H) 7 - 25 MG/DL    Creatinine 1.61 (H) 0.4 - 1.00 MG/DL    Calcium 7.7 (L) 8.5 - 10.6 MG/DL    Total Protein 5.2 (L) 6.0 - 8.0 G/DL    Total Bilirubin 0.6 0.3 - 1.2 MG/DL    Albumin 3.1 (L) 3.5 - 5.0 G/DL    Alk Phosphatase 35 25 - 110 U/L    AST (SGOT) 11 7 - 40 U/L    CO2 19 (L) 21 - 30 MMOL/L    ALT (SGPT) <3 (L) 7 - 56 U/L    Anion Gap 14 (H) 3 - 12    eGFR Non African American 9 (L) >60 mL/min    eGFR African American 11 (L) >60 mL/min   MAGNESIUM    Collection Time: 11/16/18  5:44 AM   # # Low-High    Magnesium 2.0 1.6 - 2.6 mg/dL   PHOSPHORUS    Collection Time: 11/16/18  5:44 AM   # # Low-High    Phosphorus 4.4 2.0 - 4.5 MG/DL         Physical Exam  GEN: NAD.  HEENT: Nonicteric bilaterally. NEURO: AOx3. PERRL. MAE. Pain not currently controlled.   RESP: Unlabored. RA  CARDIO: SR on monitor.   ABD: Soft, round. Incision c/d/i.  JP with SS drainage  GU: foley with yellow UOP  INTEG: dry, warm    Labs/Radiology  Pertinent Labs/radiology results reviewed.    Jerilynn Som   Pager [phone]

## 2018-11-17 ENCOUNTER — Encounter: Admit: 2018-11-17 | Discharge: 2018-11-17

## 2018-11-17 ENCOUNTER — Encounter: Admit: 2018-11-12 | Discharge: 2018-11-12

## 2018-11-17 ENCOUNTER — Ambulatory Visit: Admit: 2018-11-12 | Discharge: 2018-11-17 | Disposition: A | Discharge status: Home / Self Care

## 2018-11-17 ENCOUNTER — Ambulatory Visit: Admit: 2018-11-11 | Discharge: 2018-11-11

## 2018-11-17 ENCOUNTER — Encounter: Admit: 2018-11-14 | Discharge: 2018-11-15

## 2018-11-17 DIAGNOSIS — E785 Hyperlipidemia, unspecified: Secondary | ICD-10-CM

## 2018-11-17 DIAGNOSIS — I120 Hypertensive chronic kidney disease with stage 5 chronic kidney disease or end stage renal disease: Secondary | ICD-10-CM

## 2018-11-17 DIAGNOSIS — N186 End stage renal disease: Secondary | ICD-10-CM

## 2018-11-17 DIAGNOSIS — G4700 Insomnia, unspecified: Secondary | ICD-10-CM

## 2018-11-17 DIAGNOSIS — I749 Embolism and thrombosis of unspecified artery: Secondary | ICD-10-CM

## 2018-11-17 DIAGNOSIS — Z91013 Allergy to seafood: Secondary | ICD-10-CM

## 2018-11-17 DIAGNOSIS — E1122 Type 2 diabetes mellitus with diabetic chronic kidney disease: Secondary | ICD-10-CM

## 2018-11-17 DIAGNOSIS — Z992 Dependence on renal dialysis: Secondary | ICD-10-CM

## 2018-11-17 DIAGNOSIS — Z888 Allergy status to other drugs, medicaments and biological substances status: Secondary | ICD-10-CM

## 2018-11-17 DIAGNOSIS — K219 Gastro-esophageal reflux disease without esophagitis: Secondary | ICD-10-CM

## 2018-11-17 DIAGNOSIS — T8612 Kidney transplant failure: Secondary | ICD-10-CM

## 2018-11-17 DIAGNOSIS — R197 Diarrhea, unspecified: Secondary | ICD-10-CM

## 2018-11-17 DIAGNOSIS — Z79899 Other long term (current) drug therapy: Secondary | ICD-10-CM

## 2018-11-17 DIAGNOSIS — E559 Vitamin D deficiency, unspecified: Secondary | ICD-10-CM

## 2018-11-17 DIAGNOSIS — Q613 Polycystic kidney, unspecified: Secondary | ICD-10-CM

## 2018-11-17 DIAGNOSIS — Z881 Allergy status to other antibiotic agents status: Secondary | ICD-10-CM

## 2018-11-17 DIAGNOSIS — Z9889 Other specified postprocedural states: Secondary | ICD-10-CM

## 2018-11-17 DIAGNOSIS — D649 Anemia, unspecified: Secondary | ICD-10-CM

## 2018-11-17 DIAGNOSIS — I10 Essential (primary) hypertension: Principal | ICD-10-CM

## 2018-11-17 MED ORDER — VALGANCICLOVIR 450 MG PO TAB
450 mg | ORAL_TABLET | ORAL | 2 refills | Status: DC
Start: 2018-11-17 — End: 2018-11-19

## 2018-11-17 MED ORDER — TACROLIMUS 1 MG PO CAP
ORAL_CAPSULE | ORAL | 11 refills | 30.00000 days | Status: DC
Start: 2018-11-17 — End: 2018-11-20

## 2018-11-17 MED ORDER — VENLAFAXINE 37.5 MG PO TAB
37.5 mg | ORAL_TABLET | Freq: Every day | ORAL | 5 refills | Status: DC
Start: 2018-11-17 — End: 2018-12-21
  Filled 2018-11-17: qty 30, 30d supply, fill #1

## 2018-11-17 MED ORDER — ZOLPIDEM 5 MG PO TAB
5 mg | ORAL_TABLET | Freq: Every evening | ORAL | 0 refills | 30.00000 days | Status: DC | PRN
Start: 2018-11-17 — End: 2018-12-22
  Filled 2018-11-17: qty 15, 15d supply, fill #1

## 2018-11-17 MED ORDER — MYCOPHENOLATE SODIUM 180 MG PO TBEC
360 mg | ORAL_TABLET | Freq: Four times a day (QID) | ORAL | 11 refills | Status: AC
Start: 2018-11-17 — End: ?
  Filled 2018-12-14: qty 240, 30d supply, fill #1

## 2018-11-17 MED ORDER — SULFAMETHOXAZOLE-TRIMETHOPRIM 400-80 MG PO TAB
1 | ORAL_TABLET | Freq: Every day | ORAL | 11 refills | Status: DC
Start: 2018-11-17 — End: 2018-11-17

## 2018-11-17 MED ORDER — SULFAMETHOXAZOLE-TRIMETHOPRIM 400-80 MG PO TAB
1 | ORAL_TABLET | ORAL | 11 refills | Status: CN
Start: 2018-11-17 — End: ?
  Filled 2018-11-17: qty 30, 30d supply, fill #1

## 2018-11-17 MED ORDER — LIDOCAINE HCL 10 MG/ML (1 %) IJ SOLN
15 mL | Freq: Once | INTRAMUSCULAR | 0 refills | Status: CP
Start: 2018-11-17 — End: ?
  Administered 2018-11-17: 17:00:00 15 mL via INTRAMUSCULAR

## 2018-11-17 NOTE — Progress Notes
I have reviewed the notes, assessment, and/or procedures performed by Scott Moore, RN and concur with his documentation unless otherwise noted.

## 2018-11-17 NOTE — Patient Education
On 11/17/18 Cyndia Skeeters was provided with a list of their discharge medications through a [facility]. The patient was instructed to bring this medication list to their next doctor's appointment and to update the list with any medication changes.    Pillbox teaching was completed and where indicated, the patient was provided with additional medication and/or disease-state information. The patients medications were all filled at the [facility] outpatient pharmacy and delivered to the patients room prior to discharge. With caregivers present the patient filled up their pillbox with their medications. Patient demonstrated understanding of their medications and displayed the ability to set up their pillbox.     All patient questions were answered and patient acknowledged understanding of the medications, side effects, and other pertinent medication information.    I attended multidisciplinary rounds and agree with the plan to discharge Cyndia Skeeters. Prescilla Scheuermann has been educated on their medications and is prepared to manage their medications at home. The patient and caregivers have been provided with pharmacy contact information should questions arise.     [place], [place]  11/17/18

## 2018-11-17 NOTE — Patient Education
Pharmacy Transplant Education    The new immunosuppressive medication regimen for Diana Cooley was evaluated, and the patient was educated on these medication(s).     Regimen   The medication(s) listed below have been appropriately prescribed for immunosuppression following Kidney transplantation.     ??? Prograf (tacrolimus) 12-17 ng/mL (MEIA) or 8-12 mcg/L (HPLC/LCMS)  ??? Myfortic (mycophenolate sodium) 360mg  four times daily  ??? 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 The indication for treatment, dose, route, administration technique, frequency, and duration were discussed with the patient. The patient was educated on timely administration of therapy and management of missed doses. Instructions were given not to crush, chew, or split the tablets or capsules. Appropriate storage and disposal directions were reviewed.    The importance of adherence was discussed, and the patient was provided with tools to promote adherence.     Contraindications to therapy, safety precautions, and common side effects were discussed.  The patient was instructed to seek medical attention immediately if experiencing signs of an allergic reaction, including but not limited to: a rash; hives; itching; red, swollen, blistered, or peeling skin with or without fever.    Risk Evaluation and Mitigation Strategy (REMS) Assessment  No REMS are required for any of the prescribed medications for this patient.    REMS acknowledgement and documentation was completed with the patient and prescriber prior to transplantation, and the patient was reminded of the importance of pregnancy prevention while taking mycophenolate. A medication guide will be dispensed with the medication.    Safety Precautions  The patient was counseled on the risk of infection and malignancy associated with all immunosuppressive medications. The patient was instructed to seek medical attention if experiencing any significant side effects and/or signs of an infection, malignancy, and/or rejection.     Drug-Drug Interactions  Interactions between the patient's immunosuppressive medication(s) and other medications were assessed and reviewed with the patient. No new significant drug-drug or drug-food interactions were identified.    The patient was instructed to speak with a health care provider before starting any new medications, including prescription, over the counter, natural/herbal products, and/or vitamins.    Drug-Food Interactions  The patient was counseled to avoid consuming grapefruit, grapefruit juice, pomegranate and pomegranate juice.  While the medications can be taken without regard to food, the patient was encouraged to take the immunosuppressive medications with food for better tolerability.    Adherence  Medication adherence was emphasized for the prevention of organ rejection.  The patient was provided with a pillbox and medication calendar to promote adherence.     Follow-up  The patient was provided with pharmacy contact information and encouraged to contact a transplant pharmacist with any questions or concerns regarding the medication therapy.     Questions    Diana Cooley was given the opportunity to ask questions and did not have any questions at this time.    [place], [place]

## 2018-11-17 NOTE — Care Plan
All goals completed prior to discharge.  Problem: Infection, Risk of, Urinary Catheter-Associated Urinary Tract Infection  Goal: Absence of urinary catheter-associated infection  Outcome: Goal Achieved     Problem: Infection, Risk of, Central Venous Catheter-Associated Bloodstream Infection  Goal: Absence of CVC Associated Bloodstream infection  Outcome: Goal Achieved     Problem: Falls, High Risk of  Goal: Absence of falls-Adult Patient  Outcome: Goal Achieved  Goal: Absence of Falls-Pediatric patient  Outcome: Goal Achieved     Problem: Discharge Planning  Goal: Knowledge regarding plan of care  Outcome: Goal Achieved     Problem: Nutrition Deficit  Goal: Adequate nutritional intake  Outcome: Goal Achieved     Problem: Mobility/Activity Intolerance  Goal: Maximize functional ADL's and mobility outcomes  Outcome: Goal Achieved

## 2018-11-17 NOTE — Progress Notes
Pt arrives to [facility] minor procedure room from inpt room to have RT IJ tun HD CVC removed.  Site and focused assessment completed.  Discussed procedure and discharge instructions with the patient, understanding is verbalized.  Line pulled per protocol without complication.  Line intact, pressure held, hemostasis maintained.  Dressing applied.  Pt returns to room in [facility] pain free, rt chest dressing cdi, report called.

## 2018-11-17 NOTE — Case Management (ED)
Case Management Progress Note  Discharge Planning Phase  NAME:Diana Cooley                          MRN: [id number]              DOB:[birth year]          AGE: 58 y.o.  ADMISSION DATE: 11/12/2018             DAYS ADMITTED: LOS: 5 days      Today???s Date: 11/17/2018    Plan Pt will discharge home today with Husband; Viviann Spare. Daughter here to provide transportation and support at discharge.       Interventions  ? Support   Support: Pt/Family Updates re:POC or DC Plan     SW met with pt and daughter at bedside to discuss discharge plans.   Pt stated she feels great and ready to go home today.   Pt stated she didn't know how many days she would be in the hospital, but feels like she is good enough to go home. Pt stated she is happy to return home with [facility].     SW inquired how pt has been coping since Kidney Transplant. Pt stated I think everything has been good, I think it's gone really well.     CM will continue to follow for discharge plans.   No needs identified at this time.     ? Info or Referral      ? Discharge Planning   Discharge Planning: Home Health   SW participated in huddle this morning, stated pt is medically stable for discharge today.     ? Medication Needs      ?  Financial      ? Legal      ? Other        Disposition  ? Expected Discharge Date    Expected Discharge Date: 11/17/18  Expected Discharge Time: 1600  ? Transportation   Does the patient need discharge transport arranged?: No  Transportation Name, Phone and Availability #1: Husband; Viviann Spare  ? Next Level of Care (Acute Psych discharges only)      ? Discharge Disposition                Durable Medical Equipment      No service has been selected for the patient.      [place] Destination      No service has been selected for the patient.      [facility] - Selection Complete      Service Provider Request Status Selected Services Address Phone Number Fax Number Patient Preferred [facility] [address] [place] [place] [address] [phone] ???      [facility]      No service has been selected for the patient.        Charleston Ropes, LMSW  [phone]

## 2018-11-17 NOTE — Progress Notes
CLINICAL NUTRITION                                                        Clinical Nutrition Follow-Up Assessment   Discharge Planning Phase    Name: Diana Cooley        MRN: [id number]          DOB: 1961-02-21          Age: 58 y.o.  Admission Date: 11/12/2018             LOS: 5 days        Recommendation:  ??? Agree with current Regular/non-therapeutic diet order.   ??? Encourage PO efforts.   ??? Offer boost high protein to pt between meals to encourage PO intake.     Comments:  58 y.o. 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 This is her second kidney transplant; she underwent first transplant in 2015.  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 Pt reports poor appetite post-transplant. Pt was eating toast and bacon but now eating cheerios 3 meals/day. Pt experienced lack of appetite after her first transplant in 2015 so this is normal. Pt refused choc Boost supplement with meals but will ask nurses for supplement PRN. Pt is having acid reflux issues. Pt avoiding acidic foods and taking protonix. Dietetic intern educated pt on post-transplant nutrition safety. Pt reports loss of energy PTA. Pt requested home health and PT services after discharge. Total Urinary output 1560 ml x 24 hrs. LBM 11/17/18       Pt has been educated on post transplant nutrition guidelines including food safety, hand hygiene, grapefruit and pomegranate avoidance. Pt and/or family were given the opportunity to ask questions. Diet has been advanced to regular/non-therapeutic and pt is tolerating diet without GI distress. Pt is eating inadequately, with slowly improving intake. Pt is planning to discharge to home and husband will be preparing foods. Pt is ready for discharge from a nutritional perspective.                          Nutrition Assessment of Patient:  Admit Weight: 95.3 kg; Weight Change Since Admit: -1 kg BMI (Calculated): 37.48; BMI Categories Adult: Obesity Class II: 35-39.9  Pertinent Allergies/Intolerances: shellfish  Pertinent Labs: Cr 4.8, Ca 7.7; Pertinent Meds: Heparin, nystatin, miralax, compazine, protonix, pednisone;Oral Diet Order: Regular;   Current Oral Intake: Inadequate;Improving  Estimated Calorie Needs: 1850-2160 kcal (30-35 kcal/kg DBW 61.7 kg)   Estimated Protein Needs: 80-111 gm (1.3-1.8 gm/kg DBW 61.7 kg)    Malnutrition Assessment:  Does not meet criteria;     Nutrition Focused Physical Assessment:  Loss of Subcutaneous Fat: No;Muscle Wasting: No;Edema: Yes; Severity: Mild; Location: Lower extremities(RLE, LLE trace)  Pressure Injury: none noted    Nutrition Diagnosis:  Increased nutrient needs, specify:(calories and protein)  Etiology: increased metabolic demands  Signs & Symptoms: s/p DDRT 9/3    Intervention / Plan:  Monitor PO intake, wt trends, labs, GI health    Goals:  Avoid prolonged clear liquid status  Time Frame: Within 24 hours  Status: Met  Patient to consume >75% of meals  Time Frame: Throughout stay  Status: Partially met;Ongoing  Verbalize  understanding of diet  Time Frame: Prior to discharge  Status: Met       Dorthula Matas  Dietetic Intern

## 2018-11-17 NOTE — Progress Notes
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 Ureteral stent placed.  Induction: Thymo 150mg , Solumedrol 500mg . Myfortic 720mg  BID  Immunosuppression:    Steroid taper   Myfortic 720mg  BID   Prograf 4/3mg  BID with goal of 10-15. Follow levels    Ppx  PJP: Bactrim to start on discharge  CMV: D+/R+   Valcyte to start on discharge  Stent: Keflex while inpatient  Fungal: Nystatin QID  DVT: Heparin ppx      Plan:  Immunosuppression: Continue steroid taper and Myfortic 720mg  BID. Resume prograf 4/3. Thymo 150mg  yesterday.  *DDRT: Maintain JP drain. Bowel regimen, oral pain regimen, lidoderm patch.   *FEN: SLIV; regular diet  *MSK: ambulate, PT/OT  *Dispo: Discharge to home        Discussed with Dr. Caprice Kluver                   Subjective: Diana Cooley is a 58 y.o. patient. Pain well controlled. Able to ambulate. Has diarrhea, 6 bowel movements yesterday.     Objective:   Vitals:    11/16/18 1725 11/16/18 2044 11/16/18 2100 11/17/18 0623   BP: 138/70 (!) 172/85 (!) 153/82 (!) 148/78   BP Source: Arm, Right Upper Arm, Right Upper Arm, Right Upper Arm, Right Upper   Pulse: 81 75  73   Temp: 36.4 ???C (97.5 ???F) 36.2 ???C (97.1 ???F)  36.3 ???C (97.4 ???F)   SpO2: 100% 100%  100%   Weight:       Height:         UOP 1250 /24 h  JP 170 /24h    Results for orders placed or performed during the hospital encounter of 11/12/18 (from the past 48 hour(s))   CBC AND DIFF Collection Time: 11/15/18 10:12 AM   # # Low-High    White Blood Cells 4.1 (L) 4.5 - 11.0 K/UL    RBC 3.16 (L) 4.0 - 5.0 M/UL    Hemoglobin 10.0 (L) 12.0 - 15.0 GM/DL    Hematocrit 45.4 (L) 36 - 45 %    MCV 92.6 80 - 100 FL    MCH 31.6 26 - 34 PG    MCHC 34.2 32.0 - 36.0 G/DL    RDW 09.8 11 - 15 %    Platelet Count 88 (L) 150 - 400 K/UL    MPV 8.9 7 - 11 FL    Neutrophils 93 (H) 41 - 77 %    Lymphocytes 1 (L) 24 - 44 %    Monocytes 5 4 - 12 %    Eosinophils 0 0 - 5 %    Basophils 1 0 - 2 %    Absolute Neutrophil Count 3.86 1.8 - 7.0 K/UL    Absolute Lymph Count 0.03 (L) 1.0 - 4.8 K/UL    Absolute Monocyte Count  0.19 0 - 0.80 K/UL    Absolute Eosinophil Count 0.00 0 - 0.45 K/UL    Absolute Basophil Count 0.04 0 - 0.20 K/UL   CBC AND DIFF    Collection Time: 11/16/18  5:44 AM   # # Low-High    White Blood Cells 3.1 (L) 4.5 - 11.0 K/UL    RBC 2.94 (L) 4.0 - 5.0 M/UL    Hemoglobin 9.3 (L) 12.0 - 15.0 GM/DL    Hematocrit 29.5 (L) 36 - 45 %    MCV 92.5 80 - 100 FL    MCH 31.6 26 - 34 PG    MCHC 34.2 32.0 - 36.0 G/DL    RDW 62.1 11 - 15 %    Platelet Count 85 (L) 150 - 400 K/UL    MPV 8.7 7 - 11 FL    Neutrophils 95 (H) 41 - 77 %    Lymphocytes 1 (L) 24 - 44 %    Monocytes 3 (L) 4 - 12 %    Eosinophils 0 0 - 5 %    Basophils 1 0 - 2 %    Absolute Neutrophil Count 2.98 1.8 - 7.0 K/UL    Absolute Lymph Count 0.02 (L) 1.0 - 4.8 K/UL    Absolute Monocyte Count 0.08 0 - 0.80 K/UL    Absolute Eosinophil Count 0.01 0 - 0.45 K/UL    Absolute Basophil Count 0.01 0 - 0.20 K/UL   COMPREHENSIVE METABOLIC PANEL    Collection Time: 11/16/18  5:44 AM   # # Low-High    Sodium 137 137 - 147 MMOL/L    Potassium 3.6 3.5 - 5.1 MMOL/L    Chloride 104 98 - 110 MMOL/L    Glucose 110 (H) 70 - 100 MG/DL    Blood Urea Nitrogen 70 (H) 7 - 25 MG/DL    Creatinine 3.08 (H) 0.4 - 1.00 MG/DL    Calcium 7.7 (L) 8.5 - 10.6 MG/DL    Total Protein 5.2 (L) 6.0 - 8.0 G/DL    Total Bilirubin 0.6 0.3 - 1.2 MG/DL    Albumin 3.1 (L) 3.5 - 5.0 G/DL Alk Phosphatase 35 25 - 110 U/L    AST (SGOT) 11 7 - 40 U/L    CO2 19 (L) 21 - 30 MMOL/L    ALT (SGPT) <3 (L) 7 - 56 U/L    Anion Gap 14 (H) 3 - 12    eGFR Non African American 9 (L) >60 mL/min    eGFR African American 11 (L) >60 mL/min   MAGNESIUM    Collection Time: 11/16/18  5:44 AM   # # Low-High    Magnesium 2.0 1.6 - 2.6 mg/dL   PHOSPHORUS    Collection Time: 11/16/18  5:44 AM   # # Low-High    Phosphorus 4.4 2.0 - 4.5 MG/DL   TACROLIMUS IMMUNOASSAY (FK506)    Collection Time: 11/16/18  5:44 AM   # # Low-High    Tacrolimus Immunoassay 12.4 2 - 15 NG/ML   IRON + BINDING CAPACITY + %SAT+ FERRITIN    Collection Time: 11/16/18  5:44 AM   # # Low-High    Iron 112 50 - 160 MCG/DL    Iron Binding-TIBC 657 (L) 270 - 380 MCG/DL    % Saturation 49 (H) 28 - 42 %    Ferritin 148 10 - 200 NG/ML         Physical Exam  GEN: NAD.  HEENT: Nonicteric bilaterally.  NEURO: AOx3.  PERRL. MAE. Pain controlled.   RESP: Unlabored. RA  CARDIO: SR on monitor.   ABD: Soft, round. TTP around incision. Incision leaking minimal SS fluid. JP with SS drainage  INTEG: dry, warm    Labs/Radiology  Pertinent Labs/radiology results reviewed.    Bertram Gala, MD   Pager [phone]

## 2018-11-17 NOTE — Progress Notes
I have reviewed the notes, assessment, and/or procedures performed by Micheala, RN and concur with her documentation unless otherwise noted.

## 2018-11-17 NOTE — Discharge Instructions - Pharmacy
Discharge Summary      Name: Diana Cooley  Medical Record Number: [id number]        Account Number:  [id number]  Date Of Birth:  [birth year]                         Age:  58 years   Admit date:  November 22, 2018                     Discharge date:  11/17/2018      Discharge Attending:  Dr Nicholos Johns  Discharge Summary Completed By: Hedwig Morton, APRN-NP    Service: Surgery-Transplant    Reason for hospitalization:  ESRD (end stage renal disease) [facility]) [N18.6]    Primary Discharge Diagnosis:   ESRD (end stage renal disease) [facility])      Hospital Diagnoses:  Hospital Problems        Active Problems    * (Principal) ESRD (end stage renal disease) (HCC)    Polycystic kidney disease    Hypertension    Hyperlipidemia    Hemodialysis patient (HCC)    Kidney transplanted    Adjustment reaction with anxiety and depression    GERD (gastroesophageal reflux disease)    Vitamin D deficiency    Obesity    Insomnia    Immunosuppression (HCC)        Significant Past Medical History        Anemia  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 Patient admitted and taken to the OR 22-Nov-2018 for deceased donor renal transplant with ureteral stent. Tolerated procedure without noted complications. Induced with Thymo. Taken to floor where patient progressed as expected. Thymo course completed. Pain control achieved with oral regimen. Diet initiated and tolerated without nausea. Ambulating and voiding without difficulty. JP drain remained in place due to high output. Determined stable for discharge home with JP drain, twice weekly labs, close nephrology follow-up.     Items Needing Follow Up   Pending items or areas that need to be addressed at follow up: ureteral stent removal    Pending Labs and Follow Up Radiology    Pending labs and/or radiology review at this time of discharge are listed below: if this area is blank, there are no items for review.   Pending Labs     Order Current Status    TACROLIMUS LC-MS/MS In process    TACROLIMUS LC-MS/MS In process    TACROLIMUS LC-MS/MS In process            Medications      Medication List      START taking these medications    ??? acetaminophen 325 mg tablet; Commonly known as: TYLENOL; Dose: 650 mg;   Take two tablets by mouth every 4 hours as needed.; Quantity: 60 tablet;   Refills: 1  ??? mycophenolate DR 180 mg Tbec tablet; Commonly known as: MYFORTIC; Dose:   360 mg; Take 2 tablets by mouth four times daily.; Quantity: 240 tablet;   Refills: 11  ???  nystatin 100,000 units/mL oral suspension; Commonly known as:   MYCOSTATIN; Dose: 500,000 Units; Swish and Swallow 5 mL by mouth as   directed four times daily.; Quantity: 200 mL; Refills: 0  ??? polyethylene glycol 3350 17 gram/dose powder; Commonly known as:   MIRALAX; Dose: 17 g; Take seventeen g by mouth daily as needed for   Constipation.; Quantity: 527 g; Refills: 1  ??? trimethoprim/sulfamethoxazole 80/400 mg tablet; Commonly known as:   BACTRIM; Dose: 1 tablet; Take one tablet every Monday/Wednesday/Friday.; Quantity: 30   tablet; Refills: 11  ??? valGANciclovir 450 mg tablet; Commonly known as: VALCYTE; Dose: 450 mg;   Take 1 tablet by mouth Monday Friday.; Quantity: 60 tablet;   Refills: 2     CHANGE how you take these medications    ??? predniSONE 5 mg tablet; Commonly known as: DELTASONE; Take 20 mg daily   9/8-9/17, then take 15 mg 9/18-10/2, then 10 mg 10/3-11/1, then take 5 mg   daily; Quantity: 120 tablet; Refills: 1; What changed: how much to take, how to take this, when to take this, additional instructions  ??? tacrolimus 1 mg capsule; Commonly known as: PROGRAF; Dose: 4mg /3mg ;   Doctor's comments: Collaborating MD NPI [id number] Z94.0; Quantity: 480 capsule; Refills: 11; What        CONTINUE taking these medications    ??? atorvastatin 20 mg tablet; Commonly known as: LIPITOR; Dose: 20 mg;   Refills: 0  ??? cholecalciferol 1,000 units tablet; Commonly known as: VITAMIN D-3;   Dose: 1,000 Units; Refills: 0  ??? pantoprazole DR 40 mg tablet; Commonly known as: PROTONIX; Dose: 40 mg;   Refills: 0  ??? venlafaxine 37.5 mg tablet; Commonly known as: EFFEXOR; Dose: 37.5 mg;   Take one tablet by mouth daily for 180 days.; Quantity: 30 tablet;   Refills: 5  ??? zolpidem 5 mg tablet; Commonly known as: AMBIEN; Dose: 5 mg; Refills: 0     STOP taking these medications    ??? azaTHIOprine 50 mg tablet; Commonly known as: IMURAN  ??? calcitrioL 0.25 mcg capsule; Commonly known as: ROCALTROL  ??? carvediloL 25 mg tablet; Commonly known as: COREG  ??? lanthanum 500 mg Chew; Commonly known as: FOSRENOL  ??? peg-electrolyte solution 420 gram oral solution; Commonly known as:   NULYTELY  ??? SUPER B COMPLEX PO       Return Appointments and Scheduled Appointments     Scheduled appointments:    Nov 19, 2018  8:00 AM CDT  Procedure with CFT DRAW CHAIR 1  [facility] (--) No address on file   Nov 19, 2018  9:00 AM CDT  Hospital Follow Up with Seth Bake, MD  The [facility] (CFT [place]) [address]  1st [address]  [place] [address]  [phone]   Nov 30, 2018  3:00 PM CDT  3D Mammogram with [facility] RO[facility]  Imaging [facility]) [address]  [address]  [place] [address]  [phone]   Dec 10, 2018  8:00 AM CDT  Telehealth Group with Marvel Plan, PhD  The [facility] (Psychiatry) [address]  [address] [place] [address]  [phone]   Dec 17, 2018  8:00 AM CDT Telehealth Group with Marvel Plan, PhD  The [facility] (Psychiatry) [address]  [address] [place] [address]  [phone]   Dec 24, 2018  8:00 AM CDT  Telehealth Group with Marvel Plan, PhD  The [facility] (Psychiatry) [address]  [address] [place] [address]  [phone]   Dec 28, 2018 11:30 AM CDT  PRO 30 with Rush Landmark, MD, UROLOGY PROCEDURE RM 2  The [facility] (Urology) [address]  [address] [place] [address]  [phone]   Dec 31, 2018  8:00 AM CDT  Telehealth Group with Marvel Plan, PhD  The [facility] (Psychiatry) [address]  [address] [place] [address]  [phone]   Jan 07, 2019  8:00 AM CDT  Telehealth Group with Marvel Plan, PhD  The [facility] (Psychiatry) [address]  [address] [place] [address]  [phone]   Mar 16, 2019  3:00 PM CST  Return Patient with Dierdre Harness, MD  Internal Medicine: Madelin Rear, [facility] (Internal Medicine) [address]  [place] [address]  [phone]          Consults, Procedures, Diagnostics, Micro, Pathology   Consults: Nephrology  Surgical Procedures & Dates: deceased donor renal transplant 11/12/18  Significant Diagnostic Studies, Micro and Procedures: none  Significant Pathology: none  Nutrition: Dietitian Documentation             Loss of Subcutaneous Fat: No      Muscle Wasting: No      Edema: Yes Mild Lower extremities(RLE, LLE trace)         Discharge Disposition, Condition   Patient Disposition: Home  Condition at Discharge: Stable    Code Status     Code Status History     Date Active Date Inactive Code Status Order ID          04/28/2013 2027 05/06/2013 1833 Full Code [id number]  Gaylyn Cheers Inpatient    04/28/2013 1856 04/28/2013 2027 Full Code [id number]  Quentin Mulling, RN Inpatient Only showing the last 2 code statuses.          Patient Instructions        Regular Diet    You have no dietary restriction. Please continue with a healthy balanced diet.     Testing Not Required for Covid-19     Bathing Restrictions    Do not submerge incision. No tub baths. Showers okay.     Driving Restrictions    No driving while taking pain medication and until physician approval at follow-up appointment.     Lifting Restrictions    Do not lift more than 10 pounds for 6 week(s).     Incision Care    *Keep your incision clean and dry.  *May shower  *Do not submerge incision in tub, pool, hot tub, or lake  *Your incision should gradually look better each day. If you notice unusual swelling, redness, drainage, have increasing pain at the site, or have a fever greater than 100 degrees, notify your physician immediately.     Report These Signs and Symptoms    Please contact your doctor if you have any of the following symptoms: temperature higher than 100.4 degrees F, uncontrolled pain, persistent nausea and/or vomiting, difficulty breathing, chest pain, severe abdominal pain, unable to urinate, unable to have bowel movement or drainage with a foul odor     Opioid (Narcotic) Safety Information    OPIOID (  NARCOTIC) PAIN MEDICATION SAFETY    We care about your comfort, and believe you need opioid medications at this time to treat your pain.  An opioid is a strong pain medication.  It is only available by prescription for moderate to severe pain.  Usually these medications are used for only a short time to treat pain, but sometimes will be prescribed for longer.  Talk with your doctor or nurse about how long they expect you to need this medication.    When used the right way, opioids are safe and effective medications to treat your pain, even when used for a long time.  Yet, when used in the wrong way, opioids can be dangerous for you or others.  Opioids do not work for everyone.  Most patients do not get full relief of their pain from opioid medication; full relief of your pain may not be possible.     For your safety, we ask you to follow these instructions:    *Only take your opioid medication as prescribed.  If your pain is not controlled with the prescribed dose, or the medication is not lasting long enough, call your doctor.  *Do not break or crush your opioid medication unless your doctor or pharmacist says you can.  With certain medications, this can be dangerous, and may cause death.  *Never share your medications with others, even if they appear to have a good reason.  Never take someone else's pain medication-this is dangerous, and illegal (a crime).  Overdoses and deaths have occurred.  *Keep your opioid medications safe, as you would with cash, in a lock box or similar container.  *Make sure your opioids are going to be secure, especially if you are around children or teens.  *Talk with your doctor or pharmacist before you take other medications.  *Avoid driving, operating machinery, or drinking alcohol while taking opioid pain medication.  This may be unsafe.    Pain medications can cause constipation. Constipation is bowel movements that are less often than normal. Stools often become very hard and difficult to pass. This may lead to stomach pain and bloating. It may also cause pain when trying to use the bathroom. Constipation may be treated with suppositories, laxatives or stool softeners. A diet high in fiber with plenty of fluids helps to maintain regular, soft bowel movements.     Questions About Your Stay    For questions or concerns regarding your hospital stay, call [phone].     Discharging attending physician: Nicholos Johns [[id number]]      Strenuous Activity Restrictions    Please refrain from strenuous activity     Marty Heck care instructions:      [facility] HANDS PRIOR TO ANY HANDLING OF DRAIN. *Please write down daily (total for 24 hours) drain output, and empty multiple times a day if needed or according to physician's instructions.   *To empty the drain, open the lid on top of the bulb.  Pour the fluid into a measuring cup and record.  Close by squeezing the bulb until as much air as possible is removed, the cap the lid to recreate suction.   *IF TOLD TO DO BY YOUR DOCTOR, strip the drain twice daily by pinching the tubing near the skin with both hands.  Keep hand closest to you steady, and with the other, slowly squeeze the liquid toward the bulb.  This will prevent any clots or debris from clogging the drain.   *  It is okay to shower with the drain. Make sure the entry site is dry before placing a dressing. *Please bring the drain record to your next clinic appointment. Secure the drain to your clothes using a safety pin.  *DO NOT TRY TO PUSH THE DRAIN BACK IN IF IT GETS PULLED OUT (even a little bit), AS THIS IS A BIG INFECTION RISK.  If you have any problems with the drain (for example, large amounts of fluid, very bloody fluid, or loss of suction), please call your doctor.     Additional Discharge Instructions    CBC with diff, CMP, GGTP, Mg, Phos, Tacrolimus/Tacrolimus LC-MS levels. CMV PCR as appropriate per protocol.    Please have your labs drawn twice weekly as instructed by your coordinator. Labs should be drawn BEFORE taking morning dose of Prograf.       Additional Orders: Case Management, Supplies, Home Health     Home Health/DME              HOME HEALTH/DME  ONCE     Comments:  Home Health/Durable Medical Equipment Order Details    Patient Name:  Seleni Reller                   Medical Record Number:   [id number]    Diagnosis:  ESRD (end stage renal disease) (HCC) [N18.6]  ; Immunosuppression (HCC) [D89.9]  ; Kidney transplanted [Z94.0]  ; Polycystic kidney disease [Q61.3]  ; Hypertension [I10]  ; Hyperlipidemia [E78.5]  ; Adjustment reaction with anxiety and depression [F43.23]  ; GERD (gastroesophageal reflux disease) [K21.9]  ; Vitamin D deficiency [E55.9]  ; Obesity [E66.9] ; Insomnia [G47.00]     Agency Instructions:     Start of care within 24-48 hours of hospital discharge, anticipated discharge 11/17/18. RN to complete general assessment, including vitals with temperature, monitor and teach patient and/or caregiver medication management and compliance, monitor and teach pain management and pain medication compliance when necessary. Monitor and teach signs and symptoms of infection, monitor and teach disease management, including signs and symptoms, diet, who and when to call, hospital readmission avoidance.    Incision Care:   Site:  Left Lower Abdomen  Procedure / Date:  kidney transplant on 11/12/18  Dressing: Open to Air, closed with staples  *Keep your incision clean and dry.  *May shower  *Do not submerge incision in tub, pool, hot tub, or lake  *Your incision should gradually look better each day. If you notice unusual swelling, redness, drainage, have increasing pain at the site, or have a fever greater than 100 degrees, notify your physician immediately.  Please call Nicholos Johns (Office:  [phone]) office with questions / concerns regarding incision.    JP Care:  Site: Left Lower Abdomen  Procedure / Date:  kidney transplant on 11/12/18  Please call Nicholos Johns (Office:  [phone]) office with questions / concerns regarding drain.    Physical Therapy  to evaluate and treat along with home safety evaluation.  Teach strategies for energy conservation, mobility training, strengthening, balance activities and address deficits, maximize function and improve safety.   Occupational Therapy   to evaluate and treat along with home safety evaluation.  Teach strategies for energy conservation, mobility training, strengthening, balance activities and address deficits, maximize function and improve safety.    Clinical findings to support homebound status: Ambulates short distances only and Assistance required to ambulate/transfer    Clinical findings to support home care services: Deficits in medical management  and Muscle weakness affecting functional activities    I certify that this patient is under my care and that I, or a nurse practitioner or physician's assistant working with me, had a face-to-face encounter that meets the physician's face-to-face encounter requirements with this patient on 11/17/18.    This patient is under my care, and I have initiated the establishment of the plan of care.??? This patient will be followed by a physician after discharge, who will periodically review the plan of care.   Question Answer Comment   Attending Name/Contact Nicholos Johns (Office:  [phone]) - NPI #: [id number] Transplant Surgeon   PCP Name/Contact Dierdre Harness (Ph:  [phone] / Fx:  [phone])    Specialist Name/Contact Dr. Angelita Ingles (Ph:  [phone] / Fx:  [phone]) transplant nephrologist   Home Health to Follow Specialist                         Signed:  Hedwig Morton, APRN-NP  11/17/2018      cc:  Primary Care Physician:  Dierdre Harness   Verified  Referring physicians:  Self, Referral   Additional provider(s):        Did we miss something? If additional records are needed, please fax a request on office letterhead to [phone]. Please include the patient's name, date of birth, fax number and type of information needed. Additional request can be made by email at [email or web address]. For general questions of information about electronic records sharing, call [phone].

## 2018-11-17 NOTE — Patient Instructions
Tunneled Central Venous Catheter Removal    POST-PROCEDURE ACTIVITY:   Do not lift more than 5 lbs. and avoid any strenuous activity affecting the upper body such as pushing, pulling or straining for the next 48 hours.  POST-PROCEDURE SITE CARE:   Remove the bandage after 24 hours. You may leave this open to air. Do not use ointments, creams or powders on the puncture site or the exit site.   You may shower after 24 hours.  WHEN TO CALL THE DOCTOR :   Bright red blood has soaked the bandage.   You have signs of infection such as:  ? Chills, fever greater than 101?F  ? Increased redness, warmth or swelling at the catheter site  ? Red streaks leading from the area or pus draining from the area.  Call 911 for severe problems such as excessive bleeding, chest pain or shortness of breath.    For any of the above symptoms or for problems or concerns related to the procedure, call [phone] for Monday-Friday 7-5. After-hours and weekends, please call [phone] and ask for the Interventional Radiology Resident on-call.

## 2018-11-17 NOTE — Other
Immediate Post Procedure Note    Date:  11/17/2018                                         Attending Physician:   Dr. Arlie Solomons, MD   Performing Provider:  Cay Schillings, APRN-NP    Consent:  Consent obtained from patient.  Risks and benefits discussed with patient.  Time out performed: Consent obtained, correct patient verified, correct procedure verified, correct site verified, patient marked as necessary.  Pre/Post Procedure Diagnosis:  Renal failure now s/p functioning transplant  Indications:  Completion of therapy, removal.    Anesthesia: Local 15 mL 1% lidocaine without epinephrine  Procedure(s):  Tunneled HD catheter removal  Findings:  Successful explant of tunneled right IJ dialysis catheter with cuff fully intact. No evidence of tract site infection noted on assessment.      Estimated Blood Loss:  None/Negligible  Specimen(s) Removed/Disposition:  None  Complications: None  Patient Tolerated Procedure: Well  Post-Procedure Condition:  stable    Cay Schillings, APRN-NP  Pager [phone]

## 2018-11-17 NOTE — Progress Notes
Discharge Planning Phase    Patient is POD 5, last BM 11/17/2018, Foley removed, ambulating, patient's Pain is controlled with current analgesics.  Medication(s) being used:   Tylenol and Lidoderm patch for pain control, tolerating normal diet. Patient ready for discharge.

## 2018-11-17 NOTE — Progress Notes
Pt discharged on 11/17/2018.  Discharge instructions reviewed with patient and family. All questions answered at this time. All personal items, valuables, and home medications returned to pt. PIVs removed. Pt left unit via wheelchair with staff.

## 2018-11-17 NOTE — Discharge Planning (AHS/AVS)
Home Health:  [facility]   Phone:  [place] will see you for home health services 24-48 hours after discharge.  If you have questions regarding home health, please call [facility].

## 2018-11-17 NOTE — Progress Notes
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  Donor: DCD, Age: 53s, female, Creatinine Initial/Terminal. 1.36/1.04  KDPI: 51%, Left kidney GS 2/151 Mild focal fibrosis  Left kidney into LLQ  cPRA:??? 0% HLA MM 2/2/0  Match: VXM Negative, PXM Negative  CMV: D+/R+  Induction: thymoglobulin  Maintenance Immunosuppression: Triple drug therapy  Baseline Cr: TBD  Stent removal date: TBD  Surgeon: Armanda Magic  Post OP Course: TBD    Subjective:  Diana Cooley is feeling better today. She denies any shortness of breath, LE edema, nausea, vomiting, and feels diarrhea is improved after splitting MPA dose  She continues to feel weak however, and is happy about being set up with PT/OT at home.  She is okay with TDC being removed today     REVIEW OF SYSTEMS: Comprehensive 14-point ROS reviewed  Positives noted in HPI otherwise negative.    Allergies:   Allergies   Allergen Reactions   ??? Zofran [Ondansetron Hcl (Pf)] HYPOTENSION   ??? Shellfish Containing Products HIVES and SHORTNESS OF BREATH     Pulmonary and loss of consciousness    ??? Levaquin [Levofloxacin] NAUSEA AND VOMITING and HYPOTENSION     Problem List:  Patient Active Problem List    Diagnosis Date Noted   ??? GERD (gastroesophageal reflux disease) 11/12/2018   ??? Vitamin D deficiency 11/12/2018   ??? Obesity 11/12/2018   ??? Insomnia 11/12/2018   ??? Immunosuppression (HCC) 11/12/2018   ??? 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 Acute tubular necrosis (HCC) 05/06/2013   ??? Nausea and vomiting 04/28/2013   ??? Kidney transplanted 03/11/2013   ??? S/P bilateral salpingo-oophorectomy 01/11/2013   ??? Ovarian cyst 12/10/2012   ??? Abnormal cardiovascular stress test 07/15/2011   ??? Shellfish allergy 07/15/2011 ??? HTN (hypertension) 07/15/2011   ??? Abnormal stress test 07/15/2011   ??? Pre-transplant evaluation for ESRD (end stage renal disease) 07/15/2011   ??? 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 Hemodialysis patient (HCC) 07/03/2011   ??? Polycystic kidney disease 06/27/2011   ??? Hypertension 06/27/2011   ??? Hyperlipidemia 06/27/2011     Current Medications:    Current Facility-Administered Medications:   ???  acetaminophen (TYLENOL) tablet 650 mg, 650 mg, Oral, Q4H PRN, Trilby Leaver, MD, 650 mg at 11/16/18 1733  ???  [COMPLETED] ceFAZolin (ANCEF) IVP 500 mg, 500 mg, Intravenous, Q12H*, 500 mg at 11/14/18 0843 **FOLLOWED BY** cephalexin (KEFLEX) capsule 500 mg, 500 mg, Oral, Q12H*, Trilby Leaver, MD, 500 mg at 11/17/18 0845  ???  heparin (porcine) PF syringe 5,000 Units, 5,000 Units, Subcutaneous, Q8H, Trilby Leaver, MD  ???  lidocaine (LIDODERM) 5 % topical patch 1 patch, 1 patch, Topical, QDAY, Stopped at 11/17/18 2100 **AND** Verification of Patch Placement and Integrity - Lidocaine 5%, , Transdermal, BID, Brokaw, Stephanie L, APRN-NP  ???  mycophenolate DR (MYFORTIC) tablet 360 mg, 360 mg, Oral, QID, , , 360 mg at 11/17/18 1436  ???  nystatin (MYCOSTATIN) oral suspension 500,000 Units, 500,000 Units, Swish & Swallow, QID, Trilby Leaver, MD, 500,000 Units at 11/17/18 1438  ???  pantoprazole DR (PROTONIX) tablet 40 mg, 40 mg, Oral, QDAY(21), Brokaw, Stephanie L, APRN-NP, 40 mg at 11/16/18 2039  ???  polyethylene glycol 3350 (MIRALAX) packet 17 g, 1 packet, Oral, QDAY, Brokaw, Stephanie L, APRN-NP, 17 g at 11/13/18 0859  ???  [COMPLETED] methylPREDNISolone (SOLU-MEDROL PF) injection 120 mg, 120 mg, Intravenous, QDAY, 120 mg at 11/13/18 1250 **FOLLOWED BY** [COMPLETED] methylPREDNISolone (SOLU-MEDROL PF) injection 80 mg, 80 mg, Intravenous, QDAY, 80 mg at 11/14/18 1244 **FOLLOWED BY** [COMPLETED] methylPREDNISolone (SOLU-Medrol) injection 40 mg, 40 mg, Intravenous, QDAY, 40 mg at 11/15/18 1312 **FOLLOWED BY** [COMPLETED] predniSONE (DELTASONE) tablet 30 mg, 30 mg, Oral, QDAY, 30 mg at 11/16/18 0905 **FOLLOWED BY** predniSONE (DELTASONE) tablet 20 mg, 20 mg, Oral, QDAY, Nix, Vincenza Hews, MD, 20 mg at 11/17/18 0845  ???  senna/docusate (SENOKOT-S) tablet 2 tablet, 2 tablet, Oral, BID, Brokaw, Stephanie L, APRN-NP, Stopped at 11/14/18 2100  ???  tacrolimus (PROGRAF) capsule 3 mg, 3 mg, Oral, QDAY(18), Seth Bake, MD, 3 mg at 11/16/18 1732  ???  tacrolimus (PROGRAF) capsule 4 mg, 4 mg, Oral, QDAY(06), Seth Bake, MD, 4 mg at 11/17/18 0551  ???  valGANciclovir (VALCYTE) tablet 450 mg, 450 mg, Oral, Once per day on Tue Sat, Provider, Pharmacy, 450 mg at 11/17/18 0844  ???  venlafaxine (EFFEXOR) tablet 37.5 mg, 37.5 mg, Oral, QDAY, Brokaw, Stephanie L, APRN-NP, 37.5 mg at 11/17/18 0845    Physical Exam:  BP (!) 160/81 (BP Source: Arm, Right Upper)  - Pulse 80  - Temp 37.6 ???C (99.7 ???F)  - Ht 157.5 cm (62.01)  - Wt 94.3 kg (207 lb 13.3 oz)  - LMP 03/11/2010 Comment: Bilat. Tubes & Overies - SpO2 100%  - BMI 38.00 kg/m???     Intake/Output Summary (Last 24 hours) at 11/17/2018 1612  Last data filed at 11/17/2018 1409  Gross per 24 hour   Intake 2200 ml   Output 1998 ml   Net 202 ml     General: In NAD; A&Ox3  Skin: Warm, dry, no signs of rash   Neck: Supple   CV: Regular, regular rate, normal S1/S2, no murmurs  Lungs: CTA bilaterally in posterior fields  Abd: Grossly normal without rebound, guarding, masses, or bruits   Transplant: No overlying bruit, nontender, incision dressing in place - clean, dry, and intact closed by staples (s). JP drain in place  Ext: No edema  Neuro: No focal deficits   Psych: Affect appropriate      Laboratory studies:   CMP:  CMP Latest Ref Rng & Units 11/17/2018 11/16/2018 11/15/2018 11/14/2018 11/13/2018   NA 137 - 147 MMOL/L 135(L) 137 137 137 135(L)   K 3.5 - 5.1 MMOL/L 3.7 3.6 4.0 4.3 4.5   CL 98 - 110 MMOL/L 102 104 103 102 100 CO2 21 - 30 MMOL/L 20(L) 19(L) 21 19(L) 22   GAP 3 - 12 13(H) 14(H) 13(H) 16(H) 13(H)   BUN 7 - 25 MG/DL 60(A) 54(U) 98(J) 19(J) 42(H)   CR 0.4 - 1.00 MG/DL 4.78(G) 9.56(O) 1.30(Q) 5.53(H) 5.21(H)   GLUX 70 - 100 MG/DL 95 657(Q) 469(G) 295(M) 196(H)   CA 8.5 - 10.6 MG/DL 7.9(L) 7.7(L) 7.6(L) 7.6(L) 7.2(L)   TP 6.0 - 8.0 G/DL 4.9(L) 5.2(L) 5.2(L) 5.6(L) 5.0(L)   ALB 3.5 - 5.0 G/DL 3.0(L) 3.1(L) 3.0(L) 3.2(L) 3.0(L)   ALKP 25 - 110 U/L 29 35 34 42 37   ALT 7 - 56 U/L <3(L) <3(L) 4(L) 9 15   TBILI 0.3 - 1.2 MG/DL 0.8 0.6 0.5 0.5 0.6   GFR >60 mL/min 11(L) 9(L) 9(L) 8(L) 9(L)   GFRAA >60 mL/min  14(L) 11(L) 10(L) 10(L) 10(L)     Hemoglobin A1C (%)   Date Value   11/12/2018 5.3   05/07/2017 5.4   02/12/2016 5.1     PTH Hormone (PG/ML)   Date Value   11/12/2018 62.9   03/25/2018 559.8 (H)     BK Virus Plasma Quant (no units)   Date Value   07/01/2013 Negative for BK Virus   06/03/2013 Negative for BK Virus   05/19/2013 Negative for BK Virus   04/28/2013 Negative for BK Virus     CMV DNA Quant PCR (no units)   Date Value   11/12/2013 Positive for CMV   10/05/2013 Positive for CMV   09/22/2013 Negative for CMV   09/14/2013 Negative for CMV   09/08/2013 Negative for CMV     EBV DNA, Quant. (no units)   Date Value   04/28/2013 Negative for EBV     TACROLIMUS LEVEL:  Tacrolimus Immunoassay (NG/ML)   Date Value   11/17/2018 25.9 (HH)   11/16/2018 12.4   11/15/2018 13.4   11/14/2018 25.0 (HH)   02/12/2016 9.5   12/09/2014 7.8   01/17/2014 6.4   12/31/2013 7.9     CBC with Diff:  CBC with Diff Latest Ref Rng & Units 11/17/2018 11/16/2018   WBC 4.5 - 11.0 K/UL 3.5(L) 3.1(L)   RBC 4.0 - 5.0 M/UL 2.63(L) 2.94(L)   HGB 12.0 - 15.0 GM/DL 8.3(L) 9.3(L)   HCT 36 - 45 % 23.8(L) 27.2(L)   MCV 80 - 100 FL 90.6 92.5   MCH 26 - 34 PG 31.4 31.6   MCHC 32.0 - 36.0 G/DL 45.4 09.8   RDW 11 - 15 % 14.5 14.7   PLT 150 - 400 K/UL 85(L) 85(L)   MPV 7 - 11 FL 8.7 8.7   NEUT 41 - 77 % 94(H) 95(H)   ANC 1.8 - 7.0 K/UL 3.31 2.98   LYMA 24 - 44 % 1(L) 1(L) ALYM 1.0 - 4.8 K/UL 0.03(L) 0.02(L)   MONA 4 - 12 % 5 3(L)   AMONO 0 - 0.80 K/UL 0.16 0.08   EOSA 0 - 5 % 0 0   AEOS 0 - 0.45 K/UL 0.01 0.01   BASA 0 - 2 % 0 1   ABAS 0 - 0.20 K/UL 0.01 0.01     Lab Results   Component Value Date/Time    IRON 112 11/16/2018 05:44 AM    TIBC 228 (L) 11/16/2018 05:44 AM    PSAT 49 (H) 11/16/2018 05:44 AM    FERRITIN 148 11/16/2018 05:44 AM    FERRITIN 263 (H) 03/25/2013 07:41 AM     Urinalysis:  Lab Results   Component Value Date/Time    UCOLOR YELLOW 11/12/2018 05:20 AM    TURBID CLEAR 11/12/2018 05:20 AM    USPGR 1.008 11/12/2018 05:20 AM    UPH 5.0 11/12/2018 05:20 AM    UPROTEIN NEG 11/12/2018 05:20 AM    UAGLU NEG 11/12/2018 05:20 AM    UKET NEG 11/12/2018 05:20 AM    UBILE NEG 11/12/2018 05:20 AM    UBLD NEG 11/12/2018 05:20 AM    UROB NORMAL 11/12/2018 05:20 AM     Protein/CR ratio (no units)   Date Value   07/01/2013 0.4     Imaging:  Results for orders placed during the hospital encounter of 05/07/17   CT ABD/PELV WO CONTRAST    Impression 1. 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 Some of these cystic renal lesions demonstrate intermediate density, likely representing  intracystic hemorrhage. Evaluation for renal neoplasm is limited in the absence of IV contrast.  2. No abdominal/pelvic inflammatory mass, ascites, or bowel obstruction.  3. Development of a irregular soft tissue nodule in the inferior left breast. Recommend mammography for further evaluation.         Approved by Trey Sailors, M.D. on 05/07/2017 2:26 PM    By my electronic signature, I attest that I have personally reviewed the images for this examination and formulated the interpretations and opinions expressed in this report       Finalized by Leisa Lenz, M.D. on 05/07/2017 2:31 PM. Dictated by Trey Sailors, M.D. on 05/07/2017 2:00 PM.       Results for orders placed during the hospital encounter of 11/12/18 CHEST 2 VIEWS    Impression Minor zones of atelectasis in the left costophrenic angle.    Otherwise stable chest radiograph demonstrating no acute cardiopulmonary abnormalities.       Finalized by Darel Hong, M.D. on 11/12/2018 7:16 AM. Dictated by Darel Hong, M.D. on 11/12/2018 7:15 AM.         Assessment and Plan:  Diana. 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    #) Deceased donor renal transplant: bl Cr TBD, bl UPCR TBD  - Cr steadily improving every day - today 4.07 from 4.80 yesterday  - UOP remains non-oliguric  - TDC removal prior to discharge    #) Immunosuppression  Induction: Thymoglobulin  On maintenance Triple drug therapy  - Currently on Prograf 4 mg in AM and 3 mg in PM  *Goal tacrolimus trough first 3 months after transplant is 12-17 ng/mL (MEIA) or 8-12 mcg/L (HPLC/LCMS), HPLC preferred metric.  *Level today is a peak  *Will make no changes to dose  - Continue full dose MPA - but given diarrhea, continue to split as 360 mg PO 4 times a day  - Continue Prednisone taper to 10 mg PO daily    #) ID Proph  - CMV Donor(+)/ Recipient(+) to treat with Valcyte for 3 months post transplant  - PJP Prophylax: To treat with Bactrim/Septra for 1 year post transplant  - Nystatin swish for 2 weeks post transplant    #) BPs: Good control  - Not on any anti-hypertensive medications    #) Glycemia - BGs have been under Fair control  - Last A1C was 5.3 on 11/12/2018  - Not on any medications    #) Anemia  - Hgb currently 8.3 g/dL  - Iron studies from 03/16/1094 with adequate stores  - Drop in both Plts and Hgb likely related to thymoglobulin  - Hapto normal on 9/6, along with LDH  - Will monitor    #) CKD-MBD  - Last PTH 63 on 11/12/2018  - last Vitamin D adequate at 35 on 11/12/2018  - Ca, Phos within acceptable limits    #) Volume/Electrolytes:   - K acceptable  - Mg acceptable  - Bicarb acceptable  - Volume status - appears near euvolemic    [facility] Pan[facility] Transplant Fellow  Service Pager: [phone]

## 2018-11-18 ENCOUNTER — Encounter: Admit: 2018-11-18 | Discharge: 2018-11-18

## 2018-11-19 ENCOUNTER — Encounter: Admit: 2018-11-19 | Discharge: 2018-11-19

## 2018-11-19 ENCOUNTER — Ambulatory Visit: Admit: 2018-11-19 | Discharge: 2018-11-19

## 2018-11-19 ENCOUNTER — Ambulatory Visit: Admit: 2018-11-19 | Discharge: 2018-11-20

## 2018-11-19 DIAGNOSIS — Q613 Polycystic kidney, unspecified: Secondary | ICD-10-CM

## 2018-11-19 DIAGNOSIS — N186 End stage renal disease: Secondary | ICD-10-CM

## 2018-11-19 DIAGNOSIS — E8770 Fluid overload, unspecified: Secondary | ICD-10-CM

## 2018-11-19 DIAGNOSIS — N184 Chronic kidney disease, stage 4 (severe): Secondary | ICD-10-CM

## 2018-11-19 DIAGNOSIS — Z940 Kidney transplant status: Principal | ICD-10-CM

## 2018-11-19 DIAGNOSIS — Z9889 Other specified postprocedural states: Secondary | ICD-10-CM

## 2018-11-19 DIAGNOSIS — Z524 Kidney donor: Secondary | ICD-10-CM

## 2018-11-19 DIAGNOSIS — D649 Anemia, unspecified: Secondary | ICD-10-CM

## 2018-11-19 DIAGNOSIS — I10 Essential (primary) hypertension: Secondary | ICD-10-CM

## 2018-11-19 DIAGNOSIS — I150 Renovascular hypertension: Secondary | ICD-10-CM

## 2018-11-19 DIAGNOSIS — E785 Hyperlipidemia, unspecified: Secondary | ICD-10-CM

## 2018-11-19 DIAGNOSIS — D899 Disorder involving the immune mechanism, unspecified: Secondary | ICD-10-CM

## 2018-11-19 DIAGNOSIS — Z91013 Allergy to seafood: Secondary | ICD-10-CM

## 2018-11-19 DIAGNOSIS — Z992 Dependence on renal dialysis: Secondary | ICD-10-CM

## 2018-11-19 DIAGNOSIS — I749 Embolism and thrombosis of unspecified artery: Secondary | ICD-10-CM

## 2018-11-19 LAB — COMPREHENSIVE METABOLIC PANEL
Lab: 11 K/UL — ABNORMAL LOW (ref 3–12)
Lab: 135 MMOL/L — ABNORMAL LOW (ref 137–147)
Lab: 15 mL/min — ABNORMAL LOW (ref >60)
Lab: 3.2 mg/dL — ABNORMAL HIGH (ref 0.4–1.00)
Lab: 3.5 g/dL — ABNORMAL LOW (ref 3.5–5.0)
Lab: 4.1 MMOL/L — ABNORMAL LOW (ref 3.5–5.1)
Lab: 5 U/L — ABNORMAL LOW (ref 7–56)
Lab: 5.7 g/dL — ABNORMAL LOW (ref 6.0–8.0)
Lab: 99 mg/dL (ref 70–100)

## 2018-11-19 LAB — MAGNESIUM: Lab: 2 mg/dL (ref 1.6–2.6)

## 2018-11-19 LAB — URINALYSIS MICROSCOPIC REFLEX TO CULTURE

## 2018-11-19 LAB — URINALYSIS DIPSTICK REFLEX TO CULTURE
Lab: NEGATIVE K/UL (ref 0–0.20)
Lab: NEGATIVE mL/min — ABNORMAL LOW (ref 0–0.45)
Lab: NEGATIVE
Lab: NEGATIVE
Lab: NEGATIVE

## 2018-11-19 LAB — CBC AND DIFF
Lab: 3 M/UL — ABNORMAL LOW (ref 4.0–5.0)
Lab: 6.8 10*3/uL (ref 4.5–11.0)

## 2018-11-19 LAB — URIC ACID: Lab: 9.4 mg/dL — ABNORMAL HIGH (ref 2.0–7.0)

## 2018-11-19 LAB — TACROLIMUS LC-MS/MS: Lab: 7.3 [IU]/mL (ref 21–30)

## 2018-11-19 LAB — PROTEIN/CR RATIO,UR RAN
Lab: 0.5 mL/min — AB (ref 0–0.80)
Lab: 35 mg/dL (ref 1.003–1.035)
Lab: 64 mg/dL (ref 5.0–8.0)

## 2018-11-19 LAB — PHOSPHORUS: Lab: 3.6 mg/dL — ABNORMAL HIGH (ref 2.0–4.5)

## 2018-11-19 MED ORDER — VALGANCICLOVIR 450 MG PO TAB
450 mg | ORAL_TABLET | Freq: Every day | ORAL | 2 refills | Status: CN
Start: 2018-11-19 — End: ?

## 2018-11-19 MED ORDER — FUROSEMIDE 20 MG PO TAB
20 mg | ORAL_TABLET | Freq: Every morning | ORAL | 0 refills | 90.00000 days | Status: AC
Start: 2018-11-19 — End: ?
  Filled 2018-11-26: qty 20, 20d supply, fill #1

## 2018-11-19 MED ORDER — LOPERAMIDE 2 MG PO CAP
2 mg | ORAL_CAPSULE | Freq: Every day | ORAL | 0 refills | 10.00000 days | Status: AC
Start: 2018-11-19 — End: ?

## 2018-11-19 NOTE — Progress Notes
Last Prograf dosage 22:00  1st post op visit  CBC and chemistry back at time of visit.    Immodium 2mg  daily.  Increase Valcyte to 450mg  QD    Lasix 20mg  daily.  Kidney transplant sonogram on Monday  Return to clinic in 1 week with Dr. Caryl Comes.  Have labs drawn on Monday and Thursdays.

## 2018-11-19 NOTE — Telephone Encounter
Hospital Discharge Follow Up      Reached Patient:No, attempted twice. Mychart message also sent to patient.     Admission Information:     Hospital Name: [facility]  Admission Date: 11/12/2018  Discharge Date: 11/17/2018   Admission Diagnosis: ESRD  Discharge Diagnosis: ESRD, Polycystic kidney disease, Hypertension, Hyperlipidemia, Hemodialysis patient, Kidney transplanted, Adjustment reaction with anxiety and depression, GERD, Vitamin D deficiency, Obesity, Insomnia, Immunosuppression   Surgery: 11/12/2018 Allotransplantation kidney from non living donor without recipient nephrectomy  Has there been a discharge within the last 30 days? No  If yes, reason: N/A  Hospital Services: Unplanned  Today's call is 1(business) days post discharge     Scheduling Follow-up Appointment   Upcoming appointment date and time and with whom scheduled:   Future Appointments   Date Time Provider [place]   11/23/2018  8:20 AM CFT DRAW CHAIR 3 [facility] None   11/23/2018  9:00 AM SONO  [place] 1 [facility] Radiology   11/30/2018  3:00 PM TOMO/PROCEDURE ROOM [facility] Radiology   12/10/2018  8:00 AM Annamaria Boots, PhD [facility] Psychiatry   12/17/2018  8:00 AM Annamaria Boots, PhD [facility] Psychiatry   12/24/2018  8:00 AM Annamaria Boots, PhD [facility] Psychiatry   12/28/2018 11:30 AM Argentina Donovan, MD [facility] Urology   12/31/2018  8:00 AM Annamaria Boots, PhD [facility] Psychiatry   01/07/2019  8:00 AM Annamaria Boots, PhD [facility] Psychiatry   03/16/2019  3:00 PM Elvia Collum, MD [facility] IM     PCP appointment scheduled?Yes, Date: 03/15/2018   PCP primary location: [place]  Specialist appointment scheduled? Yes, with Psych 12/10/2018, 12/17/2018, 12/24/2018, 12/31/2018, and 01/07/2019; Urology 12/28/2018  Both PCP and Specialist appointment scheduled: Yes       Juanell Fairly

## 2018-11-19 NOTE — Progress Notes
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  Donor: DCD, Age: 50s, female, Creatinine Initial/Terminal. 1.36/1.04  KDPI: 51%, Left kidney GS 2/151 Mild focal fibrosis  Left kidney into LLQ  cPRA:??? 0% HLA MM 2/2/0  Match: VXM Negative, PXM Negative  CMV: D+/R+  Induction: thymoglobulin  Maintenance Immunosuppression: Triple drug therapy  Post OP: Slow graft function  Surgeon: Armanda Magic  ???  TRANSPLANT SYNOPSIS#1:  - Jan/2015 complicated by DGF, CMV viremia, and failed in 2017     Referring Nephrologist:  Lynetta Mare  [address] [facility] [address]  [address]  [place] [address]  Phone: [phone]  Fax: [phone]       HPI:    We had the pleasure of meeting Diana Cooley in the [facility] for renal transplant followup.    She was recently discharged from the hospital following her transplant on 9/8. Since being home she reports feeling well overall.    Diarrhea despite changing her Myfortic to 180 mg QID. Now having loose stools. 4-5 BM daily.     Urine appears grossly normal. Total output is 2 L. Water intake is approximately 2L.    Eating poorly. Cheerios three times daily. Has lost one lbs since discharge from hospital.     Decreased energy continues. Is trying to do her own ADLs.    IS: Pred 20, MPA 180 mg QID, tac 4/5. Takes medication at 2100.    REVIEW OF SYSTEMS: Comprehensive 14-point ROS reviewed  Positives noted in HPI otherwise negative.    Past History:  Medical History:   Diagnosis Date   ??? Anemia    ??? 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 Hemodialysis patient (HCC) 07/03/2011   ??? HTN (hypertension)    ??? Hyperlipemia    ??? Hyperlipidemia 06/27/2011   ??? Hypertension 06/27/2011   ??? Polycystic kidney disease 06/27/2011   ??? S/P dilatation and curettage ??? Shellfish allergy 07/15/2011       Surgical History:   Procedure Laterality Date   ??? HX OOPHORECTOMY Bilateral 2013   ??? ALLOTRANSPLANTATION KIDNEY FROM NON LIVING DONOR WITHOUT RECIPIENT NEPHRECTOMY N/A 11/12/2018    Performed by York Cerise, MD at [facility] OR   ??? DOPPLER ECHOCARDIOGRAPHY     ??? ELECTROCARDIOGRAM     ??? HX TUBAL LIGATION         Social History     Socioeconomic History   ??? Marital status: Married     Spouse name: Not on file   ??? Number of children: 1   ??? Years of education: Not on file   ??? Highest education level: Not on file   Occupational History     Employer: HOMEMAKER   Tobacco Use   ??? Smoking status: Never Smoker   ??? Smokeless tobacco: Never Used   Substance and Sexual Activity   ??? Alcohol use: No   ??? Drug use: No   ??? Sexual activity: Not Currently   Other Topics Concern   ??? Not on file   Social History Narrative   ??? Not on file       Family History   Problem Relation Age of Onset   ??? Diabetes Mother    ??? Depression  Mother    ??? Depression Father    ??? Cancer-Breast Neg Hx    ??? Cancer-Colon Neg Hx    ??? Cancer-Ovarian Neg Hx    ??? Cancer-Uterine Neg Hx        Allergies   Allergen Reactions   ??? Zofran [Ondansetron Hcl (Pf)] HYPOTENSION   ??? Shellfish Containing Products HIVES and SHORTNESS OF BREATH     Pulmonary and loss of consciousness    ??? Levaquin [Levofloxacin] NAUSEA AND VOMITING and HYPOTENSION       Current Medications:    Current Outpatient Medications:   ???  acetaminophen (TYLENOL) 325 mg tablet, Take two tablets by mouth every 4 hours as needed., Disp: 60 tablet, Rfl: 1  ???  atorvastatin (LIPITOR) 20 mg tablet, Take 20 mg by mouth at bedtime daily., Disp: , Rfl:   ???  cholecalciferol (VITAMIN D-3) 1,000 units tablet, Take 1,000 Units by mouth twice daily., Disp: , Rfl:   ???  mycophenolate DR (MYFORTIC) 180 mg TbEC tablet, Take two tablets by mouth four times daily., Disp: 240 tablet, Rfl: 11  ???  nystatin (MYCOSTATIN) 100,000 units/mL oral suspension, Swish and Swallow 5 mL by mouth as directed four times daily., Disp: 200 mL, Rfl: 0  ???  pantoprazole DR (PROTONIX) 40 mg tablet, Take 40 mg by mouth twice daily., Disp: , Rfl:   ???  polyethylene glycol 3350 (MIRALAX) 17 gram/dose powder, Take seventeen g by mouth daily as needed for Constipation., Disp: 527 g, Rfl: 1  ???  predniSONE (DELTASONE) 5 mg tablet, Take 20 mg daily 9/8-9/17, then take 15 mg 9/18-10/2, then 10 mg 10/3-11/1, then take 5 mg daily, Disp: 120 tablet, Rfl: 1  ???  tacrolimus (PROGRAF) 1 mg capsule, Take 4mg  qam and 3mg  qpm, Disp: 480 capsule, Rfl: 11  ???  trimethoprim/sulfamethoxazole (BACTRIM) 80/400 mg tablet, Take one tablet by mouth three times weekly., Disp: 30 tablet, Rfl: 11  ???  valGANciclovir (VALCYTE) 450 mg tablet, Take one tablet by mouth twice weekly., Disp: 60 tablet, Rfl: 2  ???  venlafaxine (EFFEXOR) 37.5 mg tablet, Take one tablet by mouth daily., Disp: 30 tablet, Rfl: 5  ???  zolpidem (AMBIEN) 5 mg tablet, Take one tablet by mouth at bedtime as needed for Sleep., Disp: 15 tablet, Rfl: 0    Physical Exam:  BP (!) 145/73 (BP Source: Arm, Left Upper, Patient Position: Sitting)  - Pulse 81  - Temp 36.6 ???C (97.8 ???F) (Oral)  - Ht 157.5 cm (62)  - Wt 93.4 kg (206 lb)  - LMP 03/11/2010 Comment: Bilat. Tubes & Overies - SpO2 100%  - BMI 37.68 kg/m???   General: In NAD; A&Ox3, robust and well appearing  Skin: Warm, dry, no signs of rash   HEENT: Grossly normal appearing  Neck: Supple   CV: Regular, regular rate, normal S1/S2, no murmurs  Lungs: CTA bilaterally in posterior fields  Abd: Grossly normal without rebound, guarding, masses, or bruits.   LLQ: No overlying bruit, tender, incision healing well closed by staples.  Ext: No edema  Neuro: No focal deficits   Psych: Affect appropriate  Dialysis Access: Had TDC; removed with dressing to the R chest well.    Laboratory studies:     CMP:  CMP Latest Ref Rng & Units 11/19/2018 11/17/2018 11/16/2018 11/15/2018 11/14/2018   NA 137 - 147 MMOL/L 135(L) 135(L) 137 137 137 K 3.5 - 5.1 MMOL/L 4.1 3.7 3.6 4.0 4.3   CL 98 - 110 MMOL/L 102 102 104  103 102   CO2 21 - 30 MMOL/L 22 20(L) 19(L) 21 19(L)   GAP 3 - 12 11 13(H) 14(H) 13(H) 16(H)   BUN 7 - 25 MG/DL 45(W) 09(W) 11(B) 14(N) 59(H)   CR 0.4 - 1.00 MG/DL 8.29(F) 6.21(H) 0.86(V) 5.19(H) 5.53(H)   GLUX 70 - 100 MG/DL 99 95 784(O) 962(X) 528(U)   CA 8.5 - 10.6 MG/DL 9.1 7.9(L) 7.7(L) 7.6(L) 7.6(L)   TP 6.0 - 8.0 G/DL 1.3(K) 4.9(L) 5.2(L) 5.2(L) 5.6(L)   ALB 3.5 - 5.0 G/DL 3.5 3.0(L) 3.1(L) 3.0(L) 3.2(L)   ALKP 25 - 110 U/L 37 29 35 34 42   ALT 7 - 56 U/L 5(L) <3(L) <3(L) 4(L) 9   TBILI 0.3 - 1.2 MG/DL 1.0 0.8 0.6 0.5 0.5   GFR >60 mL/min 15(L) 11(L) 9(L) 9(L) 8(L)   GFRAA >60 mL/min 18(L) 14(L) 11(L) 10(L) 10(L)     Hemoglobin A1C (%)   Date Value   11/12/2018 5.3   05/07/2017 5.4   02/12/2016 5.1     PTH Hormone (PG/ML)   Date Value   11/12/2018 62.9   03/25/2018 559.8 (H)     Lipase   Date Value Ref Range Status   04/28/2013 19 11 - 82 U/L Final   09/25/2012 27 11 - 82 U/L Final     Amylase   Date Value Ref Range Status   04/28/2013 39 24 - 100 U/L Final   09/25/2012 61 24 - 100 U/L Final     BK Virus Plasma Quant (no units)   Date Value   07/01/2013 Negative for BK Virus   06/03/2013 Negative for BK Virus   05/19/2013 Negative for BK Virus   04/28/2013 Negative for BK Virus     CMV DNA Quant PCR (no units)   Date Value   11/12/2013 Positive for CMV   10/05/2013 Positive for CMV   09/22/2013 Negative for CMV   09/14/2013 Negative for CMV   09/08/2013 Negative for CMV     No results found for: IUMLBLD  No results found for: COPIES  EBV DNA, Quant. (no units)   Date Value   04/28/2013 Negative for EBV       TACROLIMUS LEVEL:  Tacrolimus Immunoassay (NG/ML)   Date Value   11/17/2018 25.9 (HH)   11/16/2018 12.4   11/15/2018 13.4   11/14/2018 25.0 (HH)   02/12/2016 9.5   12/09/2014 7.8   01/17/2014 6.4   12/31/2013 7.9       CBC with Diff:  CBC with Diff Latest Ref Rng & Units 11/19/2018 11/17/2018   WBC 4.5 - 11.0 K/UL 6.8 3.5(L) RBC 4.0 - 5.0 M/UL 3.03(L) 2.63(L)   HGB 12.0 - 15.0 GM/DL 4.4(W) 8.3(L)   HCT 36 - 45 % 27.4(L) 23.8(L)   MCV 80 - 100 FL 90.4 90.6   MCH 26 - 34 PG 31.8 31.4   MCHC 32.0 - 36.0 G/DL 10.2 72.5   RDW 11 - 15 % 14.8 14.5   PLT 150 - 400 K/UL 164 85(L)   MPV 7 - 11 FL 8.6 8.7   NEUT 41 - 77 % 95(H) 94(H)   ANC 1.8 - 7.0 K/UL 6.43 3.31   LYMA 24 - 44 % 1(L) 1(L)   ALYM 1.0 - 4.8 K/UL 0.04(L) 0.03(L)   MONA 4 - 12 % 3(L) 5   AMONO 0 - 0.80 K/UL 0.21 0.16   EOSA 0 - 5 % 1 0   AEOS 0 - 0.45  K/UL 0.07 0.01   BASA 0 - 2 % 0 0   ABAS 0 - 0.20 K/UL 0.01 0.01     Lab Results   Component Value Date/Time    IRON 112 11/16/2018 05:44 AM    TIBC 228 (L) 11/16/2018 05:44 AM    PSAT 49 (H) 11/16/2018 05:44 AM    FERRITIN 148 11/16/2018 05:44 AM    FERRITIN 263 (H) 03/25/2013 07:41 AM       Urinalysis:  Lab Results   Component Value Date/Time    UCOLOR YELLOW 11/12/2018 05:20 AM    TURBID CLEAR 11/12/2018 05:20 AM    USPGR 1.008 11/12/2018 05:20 AM    UPH 5.0 11/12/2018 05:20 AM    UPROTEIN NEG 11/12/2018 05:20 AM    UAGLU NEG 11/12/2018 05:20 AM    UKET NEG 11/12/2018 05:20 AM    UBILE NEG 11/12/2018 05:20 AM    UBLD NEG 11/12/2018 05:20 AM    UROB NORMAL 11/12/2018 05:20 AM     Protein/CR ratio (no units)   Date Value   07/01/2013 0.4       Imaging:  Results for orders placed during the hospital encounter of 05/07/17   CT ABD/PELV WO CONTRAST    Impression 1. 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 Some of these cystic renal lesions demonstrate intermediate density, likely representing  intracystic hemorrhage. Evaluation for renal neoplasm is limited in the absence of IV contrast.  2. No abdominal/pelvic inflammatory mass, ascites, or bowel obstruction.  3. Development of a irregular soft tissue nodule in the inferior left breast. Recommend mammography for further evaluation.         Approved by Trey Sailors, M.D. on 05/07/2017 2:26 PM By my electronic signature, I attest that I have personally reviewed the images for this examination and formulated the interpretations and opinions expressed in this report       Finalized by Leisa Lenz, M.D. on 05/07/2017 2:31 PM. Dictated by Trey Sailors, M.D. on 05/07/2017 2:00 PM.       Results for orders placed during the hospital encounter of 11/12/18   CHEST 2 VIEWS    Impression Minor zones of atelectasis in the left costophrenic angle.    Otherwise stable chest radiograph demonstrating no acute cardiopulmonary abnormalities.       Finalized by Darel Hong, M.D. on 11/12/2018 7:16 AM. Dictated by Darel Hong, M.D. on 11/12/2018 7:15 AM.         Patient Active Problem List    Diagnosis Date Noted   ??? GERD (gastroesophageal reflux disease) 11/12/2018   ??? Vitamin D deficiency 11/12/2018   ??? Obesity 11/12/2018   ??? Insomnia 11/12/2018   ??? Immunosuppression (HCC) 11/12/2018   ??? 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 Acute tubular necrosis (HCC) 05/06/2013   ??? Nausea and vomiting 04/28/2013   ??? Kidney transplanted 03/11/2013   ??? S/P bilateral salpingo-oophorectomy 01/11/2013   ??? Ovarian cyst 12/10/2012   ??? Abnormal cardiovascular stress test 07/15/2011   ??? Shellfish allergy 07/15/2011   ??? HTN (hypertension) 07/15/2011   ??? Abnormal stress test 07/15/2011   ??? Pre-transplant evaluation for ESRD (end stage renal disease) 07/15/2011   ??? 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 Hemodialysis patient (HCC) 07/03/2011   ??? Polycystic kidney disease 06/27/2011   ??? Hypertension 06/27/2011   ??? Hyperlipidemia 06/27/2011       Assessment and Plan:  Ms. 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    #) Deceased donor renal transplant: bl Cr 1.36 mg/dL  - Fair renal graft function. Baseline TBD   - Creatinine 3.23 mg/dl today, best to date  - UA pending today; prior UA bland  - Stent removal date: 12/28/2018 - Has had increased drainage from her incision. Fluid is clear. Will start on her Lasix 20 mg daily and plan for repeat US on 9/14.    #) Immunosuppression - On maintenance Triple drug therapy  - Goal tacrolimus trough first 3 months after transplant is 12-17 ng/mL (MEIA) or 8-12 mcg/L (HPLC/LCMS), HPLC preferred metric.  - Currently taking Prograf 4/3 mg BID, level pending  Prednisone taper per protocol  - MPA 180 mg QID  - Will start her on Imodium daily     #) ID Proph  - CMV Donor(+)/ Recipient(+) to treat with Valcyte for 3 months post transplant; increased frequency to daily.  - PJP Prophylax: To treat with Bactrim/Septra for 1 year post transplant  - Nystatin swish for 2 weeks post transplant    #) BPs: Poor control  - Will monitor for now.    #) Glycemia - BGs have been Excellent on BMPs  - Continue to monitor and taper steroids.    #) Anemia   - Below goal at 9.6, monitor   - Iron studies acceptable    #) Electrolytes:   - K acceptable  - Mg acceptable without supplementation   - PO4 acceptable without supplementation   - Ca acceptable, PTH 62.9    #) Health maintenance  - Recommend for patient to maintain follow-up with her primary care physician or to establish with one if not already done so.  - Recommend routine and timely evaluation for all cancer screenings such as colonoscopy, prostate evaluation, mammography, PAP exams and yearly dermatology visits.  - Recommend yearly influenza vaccine in addition to any necessary NON-LIVE virus vaccines per recommendations by the [place] Preventive Services Task Force  - At the time of this note, unfortunately, I can NOT recommend Shingrix in patients with a renal transplant as case studies indicate a potential association with rejection    RTC in 1 week  Labs twice weekly    Sincerely,    Terance Hart, MD  Kidney and Pancreas Transplant                           ATTESTATION           I have personally seen and examined this patient with the fellow, performed the key portions of the E/M visit, discussed the case and concur with the fellow's documentation of history, physical exam, assessment, and treatment plan.       Staff name:  Angelita Ingles, MD Date:  11/19/2018       Cc: Lynetta Mare  Cc: Dierdre Harness    Please contact the [facility] at [phone] for any transplant related questions or concerns that may arise.

## 2018-11-20 ENCOUNTER — Encounter: Admit: 2018-11-20 | Discharge: 2018-11-20

## 2018-11-20 DIAGNOSIS — D899 Disorder involving the immune mechanism, unspecified: Secondary | ICD-10-CM

## 2018-11-20 LAB — CMV QUANT PCR-BLOOD: Lab: <50 U/L — ABNORMAL LOW (ref 25–110)

## 2018-11-20 MED ORDER — TACROLIMUS 1 MG PO CAP
ORAL_CAPSULE | ORAL | 11 refills | 30.00000 days | Status: DC
Start: 2018-11-20 — End: 2018-12-01

## 2018-11-20 NOTE — Telephone Encounter
Called pt regadring Prograf level from yesterdays clinic of 7.3.  Pt to increase from 4mg  q am and 3mg  q pm to 5mg  q am and 4mg  q pm.  Pt aware of dosage change.

## 2018-11-21 LAB — BK VIRUS DNA, QUANT PLASMA

## 2018-11-23 ENCOUNTER — Encounter: Admit: 2018-11-23 | Discharge: 2018-11-23 | Payer: MEDICARE

## 2018-11-23 ENCOUNTER — Ambulatory Visit: Admit: 2018-11-23 | Discharge: 2018-11-24 | Payer: MEDICARE

## 2018-11-23 ENCOUNTER — Ambulatory Visit: Admit: 2018-11-23 | Discharge: 2018-11-23 | Payer: MEDICARE

## 2018-11-23 LAB — COMPREHENSIVE METABOLIC PANEL
Lab: 10 U/L (ref 7–40)
Lab: 11 K/UL — ABNORMAL LOW (ref 3–12)
Lab: 118 mg/dL — ABNORMAL HIGH (ref 70–100)
Lab: 137 MMOL/L — ABNORMAL LOW (ref 137–147)
Lab: 18 mL/min — ABNORMAL LOW (ref >60)
Lab: 2.6 mg/dL — ABNORMAL HIGH (ref 0.4–1.00)
Lab: 22 mL/min — ABNORMAL LOW (ref >60)
Lab: 24 MMOL/L (ref 21–30)
Lab: 3.8 g/dL — ABNORMAL LOW (ref 3.5–5.0)
Lab: 4.3 MMOL/L — ABNORMAL LOW (ref 3.5–5.1)
Lab: 40 U/L — ABNORMAL LOW (ref 25–110)
Lab: 5.8 g/dL — ABNORMAL LOW (ref 6.0–8.0)
Lab: 8 U/L — ABNORMAL HIGH (ref 7–56)

## 2018-11-23 LAB — URINALYSIS DIPSTICK REFLEX TO CULTURE
Lab: NEGATIVE
Lab: NEGATIVE
Lab: NEGATIVE
Lab: NEGATIVE
Lab: NEGATIVE
Lab: NEGATIVE

## 2018-11-23 LAB — PROTEIN/CR RATIO,UR RAN
Lab: 25 mg/dL (ref 1.003–1.035)
Lab: 66 mg/dL (ref 5.0–8.0)
Lab: 0.4

## 2018-11-23 LAB — URIC ACID: Lab: 7.9 mg/dL — ABNORMAL HIGH (ref 2.0–7.0)

## 2018-11-23 LAB — URINALYSIS MICROSCOPIC REFLEX TO CULTURE

## 2018-11-23 LAB — CBC AND DIFF
Lab: 0 10*3/uL (ref 0–0.20)
Lab: 2.9 M/UL — ABNORMAL LOW (ref 4.0–5.0)
Lab: 9.4 10*3/uL (ref 4.5–11.0)

## 2018-11-23 LAB — MAGNESIUM: Lab: 1.3 mg/dL — ABNORMAL LOW (ref 1.6–2.6)

## 2018-11-23 LAB — PHOSPHORUS: Lab: 3 mg/dL — ABNORMAL HIGH (ref 2.0–4.5)

## 2018-11-23 NOTE — Progress Notes
Labs drawn without SONO and labeled at the bedside.

## 2018-11-24 ENCOUNTER — Encounter: Admit: 2018-11-24 | Discharge: 2018-11-24 | Payer: MEDICARE

## 2018-11-24 LAB — TACROLIMUS LC-MS/MS: Lab: 10 mg/dL — ABNORMAL HIGH (ref 0.3–1.2)

## 2018-11-24 NOTE — Telephone Encounter
Called patient regarding lab results from 11/23/18.  Results released to pt's MyChart.  Cr 2.67.  Prograf level 10.9.  No change in dosage needed.  Pt aware.

## 2018-11-26 ENCOUNTER — Encounter: Admit: 2018-11-26 | Discharge: 2018-11-26 | Payer: MEDICARE

## 2018-11-26 ENCOUNTER — Ambulatory Visit: Admit: 2018-11-26 | Discharge: 2018-11-26 | Payer: MEDICARE

## 2018-11-26 LAB — URIC ACID: Lab: 6.4 mg/dL (ref 2.0–7.0)

## 2018-11-26 LAB — LIPID PROFILE
Lab: 133 mg/dL
Lab: 171 mg/dL (ref <200)
Lab: 305 mg/dL — ABNORMAL HIGH (ref <150)
Lab: 61 mg/dL
Lab: 89 mg/dL (ref <100)

## 2018-11-26 LAB — URINALYSIS DIPSTICK REFLEX TO CULTURE
Lab: NEGATIVE
Lab: NEGATIVE

## 2018-11-26 LAB — CBC AND DIFF
Lab: 11 10*3/uL — ABNORMAL HIGH (ref 1.8–7.0)
Lab: 12 10*3/uL — ABNORMAL HIGH (ref 4.5–11.0)
Lab: 15 % — ABNORMAL HIGH (ref 11–15)
Lab: 209 K/UL (ref 150–400)
Lab: 28 % — ABNORMAL LOW (ref 36–45)
Lab: 3 M/UL — ABNORMAL LOW (ref 4.0–5.0)
Lab: 34 g/dL (ref 32.0–36.0)
Lab: 8.8 FL (ref 7–11)
Lab: 9.9 g/dL — ABNORMAL LOW (ref 12.0–15.0)

## 2018-11-26 LAB — PROTEIN/CR RATIO,UR RAN
Lab: 34 mg/dL
Lab: 91 mg/dL

## 2018-11-26 LAB — PHOSPHORUS: Lab: 3.2 mg/dL (ref 2.0–4.5)

## 2018-11-26 LAB — MAGNESIUM: Lab: 1 mg/dL — ABNORMAL LOW (ref 1.6–2.6)

## 2018-11-26 LAB — URINALYSIS MICROSCOPIC REFLEX TO CULTURE

## 2018-11-26 LAB — COMPREHENSIVE METABOLIC PANEL
Lab: 136 MMOL/L — ABNORMAL LOW (ref 137–147)
Lab: 4.4 MMOL/L (ref 3.5–5.1)

## 2018-11-26 LAB — HEMOGLOBIN A1C: Lab: 5.6 % — ABNORMAL LOW (ref >40)

## 2018-11-26 MED ORDER — SODIUM CHLORIDE 0.9 % IV SOLP
2000 mL | INTRAVENOUS | 0 refills | Status: AC
Start: 2018-11-26 — End: ?

## 2018-11-26 MED ORDER — PROCHLORPERAZINE MALEATE 10 MG PO TAB
10 mg | ORAL_TABLET | ORAL | 0 refills | Status: AC | PRN
Start: 2018-11-26 — End: ?
  Filled 2018-11-26: qty 10, 3d supply, fill #1

## 2018-11-26 NOTE — Patient Instructions
Return to clinic in a week.    Have labs done twice a week.

## 2018-11-27 ENCOUNTER — Encounter: Admit: 2018-11-27 | Discharge: 2018-11-27 | Payer: MEDICARE

## 2018-11-27 LAB — TACROLIMUS LC-MS/MS: Lab: 10

## 2018-11-30 ENCOUNTER — Ambulatory Visit: Admit: 2018-11-30 | Discharge: 2018-11-30 | Payer: MEDICARE

## 2018-11-30 ENCOUNTER — Encounter: Admit: 2018-11-30 | Discharge: 2018-11-30 | Payer: MEDICARE

## 2018-11-30 LAB — COMPREHENSIVE METABOLIC PANEL
Lab: 1.1 mg/dL (ref 0.3–1.2)
Lab: 10 mg/dL (ref 8.5–10.6)
Lab: 102 MMOL/L (ref 98–110)
Lab: 119 mg/dL — ABNORMAL HIGH (ref 70–100)
Lab: 138 MMOL/L (ref 137–147)
Lab: 2 mg/dL — ABNORMAL HIGH (ref 0.4–1.00)
Lab: 24 MMOL/L (ref 21–30)
Lab: 24 mg/dL (ref 7–25)
Lab: 4 g/dL (ref 3.5–5.0)
Lab: 4.1 MMOL/L (ref 3.5–5.1)
Lab: 54 U/L (ref 25–110)
Lab: 6.4 g/dL (ref 6.0–8.0)
Lab: 8 U/L (ref 7–56)
Lab: 9 U/L (ref 7–40)

## 2018-11-30 LAB — URINALYSIS DIPSTICK REFLEX TO CULTURE
Lab: 1 U/L (ref 1.003–1.035)
Lab: 6 K/UL (ref 5.0–8.0)
Lab: NEGATIVE 10*3/uL (ref 0–0.20)
Lab: NEGATIVE mL/min (ref 0–0.45)
Lab: NEGATIVE mL/min — ABNORMAL LOW (ref 0–0.80)
Lab: NEGATIVE
Lab: NEGATIVE
Lab: NEGATIVE

## 2018-11-30 LAB — CBC AND DIFF
Lab: 0 % (ref 0–2)
Lab: 0 10*3/uL (ref 0–0.20)
Lab: 0 10*3/uL (ref 0–0.45)
Lab: 0.1 10*3/uL (ref 0–0.80)
Lab: 0.1 10*3/uL — ABNORMAL LOW (ref 1.0–4.8)
Lab: 1 % (ref 0–5)
Lab: 136 10*3/uL — ABNORMAL LOW (ref 150–400)
Lab: 18 % — ABNORMAL HIGH (ref 11–15)
Lab: 2 % — ABNORMAL LOW (ref 24–44)
Lab: 29 % — ABNORMAL LOW (ref 36–45)
Lab: 3 % — ABNORMAL LOW (ref 4–12)
Lab: 3 M/UL — ABNORMAL LOW (ref >60)
Lab: 32 pg (ref 26–34)
Lab: 33 g/dL (ref 32.0–36.0)
Lab: 6.3 10*3/uL (ref 1.8–7.0)
Lab: 6.7 10*3/uL (ref 4.5–11.0)
Lab: 9.4 FL (ref 7–11)
Lab: 9.9 g/dL — ABNORMAL LOW (ref >60)
Lab: 94 % — ABNORMAL HIGH (ref 41–77)
Lab: 95 FL (ref 80–100)

## 2018-11-30 LAB — PROTEIN/CR RATIO,UR RAN
Lab: 19 mg/dL
Lab: 77 mg/dL
Lab: 0.2

## 2018-11-30 LAB — URINALYSIS MICROSCOPIC REFLEX TO CULTURE

## 2018-11-30 LAB — PHOSPHORUS: Lab: 3.8 mg/dL (ref 2.0–4.5)

## 2018-11-30 LAB — URIC ACID: Lab: 7.3 mg/dL — ABNORMAL HIGH (ref 2.0–7.0)

## 2018-11-30 LAB — MAGNESIUM: Lab: 1.1 mg/dL — ABNORMAL LOW (ref 1.6–2.6)

## 2018-12-01 ENCOUNTER — Encounter: Admit: 2018-12-01 | Discharge: 2018-12-01 | Payer: MEDICARE

## 2018-12-01 LAB — TACROLIMUS LC-MS/MS: Lab: 22 — ABNORMAL HIGH

## 2018-12-01 MED ORDER — TACROLIMUS 1 MG PO CAP
3 mg | ORAL_CAPSULE | Freq: Two times a day (BID) | ORAL | 11 refills | 30.00000 days | Status: DC
Start: 2018-12-01 — End: 2018-12-16

## 2018-12-01 NOTE — Telephone Encounter
Reviewed labs with Dr Caryl Comes and pt from 12/01/18.  FK level 22.  Pt did not take prograf prior to labs.  Instructed pt to hold prograf tonight and start 3 mg bid tomorrow.  Pt read back dose change.

## 2018-12-02 ENCOUNTER — Encounter: Admit: 2018-12-02 | Discharge: 2018-12-02 | Payer: MEDICARE

## 2018-12-03 ENCOUNTER — Encounter: Admit: 2018-12-03 | Discharge: 2018-12-03 | Payer: MEDICARE

## 2018-12-03 ENCOUNTER — Ambulatory Visit: Admit: 2018-12-03 | Discharge: 2018-12-03 | Payer: MEDICARE

## 2018-12-03 LAB — URINALYSIS MICROSCOPIC REFLEX TO CULTURE

## 2018-12-03 LAB — URINALYSIS DIPSTICK REFLEX TO CULTURE
Lab: 1 (ref 1.003–1.035)
Lab: 6 (ref 5.0–8.0)
Lab: NEGATIVE
Lab: NEGATIVE
Lab: NEGATIVE
Lab: NEGATIVE

## 2018-12-03 LAB — PROTEIN/CR RATIO,UR RAN
Lab: 15 mg/dL
Lab: 81 mg/dL
Lab: 0.2

## 2018-12-03 NOTE — Patient Instructions
Return to clinic in 1 week.    Have labs done on Monday.

## 2018-12-03 NOTE — Progress Notes
VAT attempted to draw labs and was unsuccessful. Dr. informed.

## 2018-12-03 NOTE — Progress Notes
No labs done today    Drain pulled without difficulty.  Staples removed.  Steristrips applied.  Upper part of incision with small amount of dried drainage.  Staples to that area not holding any skin together.      Labs next Monday.   RTC in 1 week.

## 2018-12-03 NOTE — Progress Notes
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  Donor: DCD, Age: 104s, female, Creatinine Initial/Terminal. 1.36/1.04  KDPI: 51%, Left kidney GS 2/151 Mild focal fibrosis  Left kidney into LLQ  cPRA:? 0% HLA MM 2/2/0  Match: VXM Negative, PXM Negative  CMV: D+/R+  Induction: thymoglobulin  Maintenance Immunosuppression: Triple drug therapy  Post OP: Slow graft function  Surgeon: Armanda Magic  Stent Removal: Oct 19th?    TRANSPLANT SYNOPSIS#1:  - Jan/2015 complicated by DGF, CMV viremia, and failed in 2017     Referring Nephrologist:  Lynetta Mare  [address] [facility] [address]  [address]  [place] [address]  Phone: [phone]  Fax: [phone]       HPI:    We had the pleasure of meeting Diana Cooley in the [facility] for renal transplant followup.    Accompanied by her spouse.    Last seen in clinic 9/17. She feels better than last week.    Blood pressure normal again. Drinking 2 liters daily. Nausea generally improved. Urine output 1.5-1.8 liters daily.     Taking pepcid and protonix for heartburn which seems to help.     Right lower leg swelling unchanged. Not painful but noticeably asymmetric. No redness or warmth.    Diarrhea stable at 2 loose BM per day. Taking Imodium daily.     Has a JP drain. Drainage improving. Down to 30 ml/day.    Weight stable at 89 kg.    IS: Pred 15, MPA 180 mg QID, tac 3/3 (held last night's dose due to level adjustment). Usually takes medication at 1000/2200. Failed IV therapy phlebotomy today.     REVIEW OF SYSTEMS: Comprehensive 14-point ROS reviewed  Positives noted in HPI otherwise negative.    Past History:  Medical History:   Diagnosis Date   ? Anemia    ? 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 Hemodialysis patient (HCC) 07/03/2011 ? HTN (hypertension)    ? Hyperlipemia    ? Hyperlipidemia 06/27/2011   ? Hypertension 06/27/2011   ? 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 S/P dilatation and curettage    ? Shellfish allergy 07/15/2011       Surgical History:   Procedure Laterality Date   ? HX OOPHORECTOMY Bilateral 2013   ? ALLOTRANSPLANTATION KIDNEY FROM NON LIVING DONOR WITHOUT RECIPIENT NEPHRECTOMY N/A 11/12/2018    Performed by York Cerise, MD at [facility] OR   ? DOPPLER ECHOCARDIOGRAPHY     ? ELECTROCARDIOGRAM     ? HX TUBAL LIGATION         Social History     Socioeconomic History   ? Marital status: Married     Spouse name: Not on file   ? Number of children: 1   ? Years of education: Not on file   ? Highest education level: Not on file   Occupational History     Employer: HOMEMAKER   Tobacco Use   ? Smoking status: Never Smoker   ? Smokeless tobacco: Never Used   Substance and Sexual Activity   ? Alcohol use: No   ? Drug use: No   ? Sexual activity: Not Currently   Other Topics Concern   ? Not  on file   Social History Narrative   ? Not on file       Family History   Problem Relation Age of Onset   ? Diabetes Mother    ? Depression Mother    ? Depression Father    ? Cancer-Breast Neg Hx    ? Cancer-Colon Neg Hx    ? Cancer-Ovarian Neg Hx    ? Cancer-Uterine Neg Hx        Allergies   Allergen Reactions   ? Zofran [Ondansetron Hcl (Pf)] HYPOTENSION   ? Shellfish Containing Products HIVES and SHORTNESS OF BREATH     Pulmonary and loss of consciousness    ? Levaquin [Levofloxacin] NAUSEA AND VOMITING and HYPOTENSION       Current Medications:    Current Outpatient Medications:   ?  acetaminophen (TYLENOL) 325 mg tablet, Take two tablets by mouth every 4 hours as needed., Disp: 60 tablet, Rfl: 1  ?  atorvastatin (LIPITOR) 20 mg tablet, Take 20 mg by mouth at bedtime daily., Disp: , Rfl:   ?  cholecalciferol (VITAMIN D-3) 1,000 units tablet, Take 1,000 Units by mouth twice daily., Disp: , Rfl: ?  furosemide (LASIX) 20 mg tablet, Take one tablet by mouth every morning., Disp: 20 tablet, Rfl: 0  ?  loperamide (IMODIUM A-D) 2 mg capsule, Take one capsule by mouth daily. Take 2 capsules by mouth initially, followed by 1 capsule by mouth after each loose stool up to a maximum of 8 tablets in 24 hours., Disp: 30 capsule, Rfl:   ?  mycophenolate DR (MYFORTIC) 180 mg TbEC tablet, Take two tablets by mouth four times daily., Disp: 240 tablet, Rfl: 11  ?  nystatin (MYCOSTATIN) 100,000 units/mL oral suspension, Swish and Swallow 5 mL by mouth as directed four times daily., Disp: 200 mL, Rfl: 0  ?  pantoprazole DR (PROTONIX) 40 mg tablet, Take 40 mg by mouth twice daily., Disp: , Rfl:   ?  predniSONE (DELTASONE) 5 mg tablet, Take 20 mg daily 9/8-9/17, then take 15 mg 9/18-10/2, then 10 mg 10/3-11/1, then take 5 mg daily, Disp: 120 tablet, Rfl: 1  ?  prochlorperazine maleate (COMPAZINE) 10 mg tablet, Take one tablet by mouth every 6 hours as needed for Nausea or Vomiting., Disp: 10 tablet, Rfl: 0  ?  tacrolimus (PROGRAF) 1 mg capsule, Take three capsules by mouth twice daily., Disp: 180 capsule, Rfl: 11  ?  trimethoprim/sulfamethoxazole (BACTRIM) 80/400 mg tablet, Take one tablet by mouth three times weekly., Disp: 30 tablet, Rfl: 11  ?  valGANciclovir (VALCYTE) 450 mg tablet, Take one tablet by mouth daily with breakfast., Disp: 30 tablet, Rfl: 2  ?  venlafaxine (EFFEXOR) 37.5 mg tablet, Take one tablet by mouth daily., Disp: 30 tablet, Rfl: 5  ?  zolpidem (AMBIEN) 5 mg tablet, Take one tablet by mouth at bedtime as needed for Sleep., Disp: 15 tablet, Rfl: 0    Physical Exam:  BP 127/69 (BP Source: Arm, Right Upper, Patient Position: Standing)  - Pulse 100  - Temp 36.6 ?C (97.9 ?F) (Oral)  - Ht 157.5 cm (62)  - Wt 89.4 kg (197 lb 3.2 oz)  - LMP 03/11/2010 Comment: Bilat. Tubes & Overies - SpO2 100%  - BMI 36.07 kg/m?     General: In NAD; A&Ox3, robust and well appearing  Skin: Warm, dry, no signs of rash HEENT: Grossly normal appearing  Neck: Supple   CV: Regular, regular rate, normal S1/S2, no murmurs  Lungs: CTA  bilaterally in posterior fields  Abd: Grossly normal without rebound, guarding, masses, or bruits. LLQ drain in place with serous drainage.  LLQ: No overlying bruit, tender, incision healing well closed by staples.  Ext: No edema. Extensive bruising along both arms  Neuro: No focal deficits   Psych: Affect appropriate      Laboratory studies:     CMP:  CMP Latest Ref Rng & Units 11/30/2018 11/26/2018 11/23/2018 11/19/2018 11/17/2018   NA 137 - 147 MMOL/L 138 136(L) 137 135(L) 135(L)   K 3.5 - 5.1 MMOL/L 4.1 4.4 4.3 4.1 3.7   CL 98 - 110 MMOL/L 102 101 102 102 102   CO2 21 - 30 MMOL/L 24 23 24 22  20(L)   GAP 3 - 12 12 12 11 11  13(H)   BUN 7 - 25 MG/DL 24 16(X) 09(U) 04(V) 40(J)   CR 0.4 - 1.00 MG/DL 8.11(B) 1.47(W) 2.95(A) 3.23(H) 4.07(H)   GLUX 70 - 100 MG/DL 213(Y) 865(H) 846(N) 99 95   CA 8.5 - 10.6 MG/DL 62.9 9.4 8.9 9.1 7.9(L)   TP 6.0 - 8.0 G/DL 6.4 6.4 5.2(W) 5.7(L) 4.9(L)   ALB 3.5 - 5.0 G/DL 4.0 3.9 3.8 3.5 3.0(L)   ALKP 25 - 110 U/L 54 45 40 37 29   ALT 7 - 56 U/L 8 8 8  5(L) <3(L)   TBILI 0.3 - 1.2 MG/DL 1.1 0.9 0.8 1.0 0.8   GFR >60 mL/min 25(L) 23(L) 18(L) 15(L) 11(L)   GFRAA >60 mL/min 30(L) 28(L) 22(L) 18(L) 14(L)     Hemoglobin A1C (%)   Date Value   11/26/2018 5.6   11/12/2018 5.3   05/07/2017 5.4     PTH Hormone (PG/ML)   Date Value   11/12/2018 62.9   03/25/2018 559.8 (H)     Lipase   Date Value Ref Range Status   04/28/2013 19 11 - 82 U/L Final   09/25/2012 27 11 - 82 U/L Final     Amylase   Date Value Ref Range Status   04/28/2013 39 24 - 100 U/L Final   09/25/2012 61 24 - 100 U/L Final     BK Virus Plasma Quant (no units)   Date Value   11/19/2018     NOT DETECTED  Reference range: NOT DETECTED  Unit: copies/mL  TESTING PERFORMED AT LOW VOLUME ON PLASMA SPECIMEN FOR BKV, MAY  AFFECT RESULTS.  Marland Kitchen  Assay Range: 39 copies/mL to 1.00E+10 copies/mL  . The limit of quantitation (LOQ) is 39 copies/mL. BK virus DNA  detected below the  LOQ will be reported as Detected:<39 copies/mL.  .  This test was developed and its performance characteristics  determined by  [employer]. It has not been cleared or approved by the U.[place]  Food and Drug  Administration. Results should be used in conjunction with clinical  findings,  and should not form the sole basis for a diagnosis or treatment  decision.  ____________________________________________________________  Testing Performed At:  [facility]  [address]  [place], [place] [address]  [phone]  CLIA ID: [id number]     07/01/2013 Negative for BK Virus   06/03/2013 Negative for BK Virus   05/19/2013 Negative for BK Virus   04/28/2013 Negative for BK Virus     CMV DNA Quant PCR   Date Value   11/19/2018 CMV DNA NOT DETECTED [IU]/mL   11/12/2013 Positive for CMV   10/05/2013 Positive for CMV   09/22/2013 Negative for CMV  09/14/2013 Negative for CMV     IU/mL CMV Blood (no units)   Date Value   11/19/2018     <50 IU/mL  The test method detects and quantitates CMV DNA using the Abbott RealTime assay,   and is approved by the FDA for monitoring hematopoietic stem cell transplant   patients who are undergoing anti-CMV therapy.  Please correlate results with the   clinical status of the patient.       No results found for: COPIES  EBV DNA, Quant. (no units)   Date Value   04/28/2013 Negative for EBV       TACROLIMUS LEVEL:  Tacrolimus Immunoassay (NG/ML)   Date Value   11/17/2018 25.9 (HH)   11/16/2018 12.4   11/15/2018 13.4   11/14/2018 25.0 (HH)   02/12/2016 9.5   12/09/2014 7.8   01/17/2014 6.4   12/31/2013 7.9       CBC with Diff:  CBC with Diff Latest Ref Rng & Units 11/30/2018 11/26/2018   WBC 4.5 - 11.0 K/UL 6.7 12.3(H)   RBC 4.0 - 5.0 M/UL 3.07(L) 3.08(L)   HGB 12.0 - 15.0 GM/DL 1.6(X) 0.9(U)   HCT 36 - 45 % 29.4(L) 28.8(L)   MCV 80 - 100 FL 95.8 93.3   MCH 26 - 34 PG 32.2 32.1 MCHC 32.0 - 36.0 G/DL 04.5 40.9   RDW 11 - 15 % 18.0(H) 15.8(H)   PLT 150 - 400 K/UL 136(L) 209   MPV 7 - 11 FL 9.4 8.8   NEUT 41 - 77 % 94(H) 95(H)   ANC 1.8 - 7.0 K/UL 6.33 11.73(H)   LYMA 24 - 44 % 2(L) 1(L)   ALYM 1.0 - 4.8 K/UL 0.11(L) 0.15(L)   MONA 4 - 12 % 3(L) 3(L)   AMONO 0 - 0.80 K/UL 0.17 0.32   EOSA 0 - 5 % 1 1   AEOS 0 - 0.45 K/UL 0.04 0.08   BASA 0 - 2 % 0 0   ABAS 0 - 0.20 K/UL 0.02 0.04     Lab Results   Component Value Date/Time    IRON 112 11/16/2018 05:44 AM    TIBC 228 (L) 11/16/2018 05:44 AM    PSAT 49 (H) 11/16/2018 05:44 AM    FERRITIN 148 11/16/2018 05:44 AM    FERRITIN 263 (H) 03/25/2013 07:41 AM       Urinalysis:  Lab Results   Component Value Date/Time    UCOLOR YELLOW 11/30/2018 08:27 AM    TURBID CLEAR 11/30/2018 08:27 AM    USPGR 1.010 11/30/2018 08:27 AM    UPH 6.0 11/30/2018 08:27 AM    UPROTEIN NEG 11/30/2018 08:27 AM    UAGLU NEG 11/30/2018 08:27 AM    UKET NEG 11/30/2018 08:27 AM    UBILE NEG 11/30/2018 08:27 AM    UBLD 1+ (A) 11/30/2018 08:27 AM    UROB NORMAL 11/30/2018 08:27 AM     Protein/CR ratio (no units)   Date Value   11/30/2018 0.2   11/26/2018 0.4   11/23/2018 0.4   11/19/2018 0.5   07/01/2013 0.4       Imaging:  Results for orders placed during the hospital encounter of 05/07/17   CT ABD/PELV WO CONTRAST    Impression 1. 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 Some of these cystic renal lesions demonstrate intermediate density, likely representing  intracystic hemorrhage. Evaluation for renal neoplasm is limited in the  absence of IV contrast.  2. No abdominal/pelvic inflammatory mass, ascites, or bowel obstruction.  3. Development of a irregular soft tissue nodule in the inferior left breast. Recommend mammography for further evaluation.         Approved by Trey Sailors, M.D. on 05/07/2017 2:26 PM    By my electronic signature, I attest that I have personally reviewed the images for this examination and formulated the interpretations and opinions expressed in this report       Finalized by Leisa Lenz, M.D. on 05/07/2017 2:31 PM. Dictated by Trey Sailors, M.D. on 05/07/2017 2:00 PM.       Results for orders placed during the hospital encounter of 11/12/18   CHEST 2 VIEWS    Impression Minor zones of atelectasis in the left costophrenic angle.    Otherwise stable chest radiograph demonstrating no acute cardiopulmonary abnormalities.       Finalized by Darel Hong, M.D. on 11/12/2018 7:16 AM. Dictated by Darel Hong, M.D. on 11/12/2018 7:15 AM.         Patient Active Problem List    Diagnosis Date Noted   ? GERD (gastroesophageal reflux disease) 11/12/2018   ? Vitamin D deficiency 11/12/2018   ? Obesity 11/12/2018   ? Insomnia 11/12/2018   ? Immunosuppression (HCC) 11/12/2018   ? 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 Acute tubular necrosis (HCC) 05/06/2013   ? Nausea and vomiting 04/28/2013   ? Kidney transplanted 03/11/2013   ? S/P bilateral salpingo-oophorectomy 01/11/2013   ? Ovarian cyst 12/10/2012   ? Abnormal cardiovascular stress test 07/15/2011   ? Shellfish allergy 07/15/2011   ? HTN (hypertension) 07/15/2011   ? Abnormal stress test 07/15/2011   ? Pre-transplant evaluation for ESRD (end stage renal disease) 07/15/2011   ? 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 Hemodialysis patient (HCC) 07/03/2011   ? 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 Hypertension 06/27/2011   ? Hyperlipidemia 06/27/2011   Imaging:    RUS 9/14:  IMPRESSION     1. ?Normal appearance of the renal transplant without evidence of vascular   compromise or hydronephrosis with indwelling nephroureteral stent in   place.   2. ?No significant change in small, mildly complex fluid collection along   the superomedial aspect of the transplant kidney, likely a small hematoma.       Assessment and Plan:  Ms. 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    #) Deceased donor renal transplant: bl Cr 1.36 mg/dL  - Fair renal graft function. Baseline TBD   Creatinine 2, downtrending despite tac level >20   - Stent removal date: 12/28/2018  Remove drain and staples    #) Immunosuppression - On maintenance Triple drug therapy  - Goal tacrolimus trough first 3 months after transplant is 12-17 ng/mL (MEIA) or 8-12 mcg/L (HPLC/LCMS), HPLC preferred metric.  - Currently taking Prograf 3/3 mg (just changed), level above goal. Check lab 9/28 (weekly due to poor IV access).   Prednisone taper per protocol  - MPA 180 mg QID. Dosage adjustment due to severe diarrhea.     #) ID Proph  - CMV Donor(+)/ Recipient(+) to treat with Valcyte for 3 months post transplant  - PJP Prophylax: To treat with Bactrim/Septra for 1 year post transplant      #) BPs:   - Will monitor for now.  Normalized. Off medication. Continue generous  hydration.     #) Glycemia - BGs have been Excellent on BMPs  - Continue to monitor and taper steroids.    #) Anemia   - Below goal at 9.9, monitor   - Iron studies acceptable    #) Electrolytes:   - K acceptable  - Mg acceptable without supplementation   - PO4 acceptable without supplementation   - Ca acceptable, PTH 62.9    #) Health maintenance  Colonoscopy 05/03/2013 focal colitis, diverticulosis. Repeat 2025.  Mammogram 11/2017 normal, repeat annually  Pap smear 06/2017 normal  Flu vaccine recommended at 3 months post.     RTC in 1 week  Labs weekly (poor access)    Sincerely,          Staff name:  Angelita Ingles, MD Date:  12/03/2018         Cc: Lynetta Mare  Cc: Dierdre Harness    Please contact the [facility] at [phone] for any transplant related questions or concerns that may arise.

## 2018-12-04 ENCOUNTER — Encounter: Admit: 2018-12-04 | Discharge: 2018-12-04 | Payer: MEDICARE

## 2018-12-04 NOTE — Telephone Encounter
Received a call from the outpatient lab. They did not receive any blood work on the patient from yesterday so they cannot result her standard lab set. They only received a urine sample.

## 2018-12-07 ENCOUNTER — Encounter

## 2018-12-07 DIAGNOSIS — D899 Disorder involving the immune mechanism, unspecified: Secondary | ICD-10-CM

## 2018-12-07 DIAGNOSIS — Z940 Kidney transplant status: Secondary | ICD-10-CM

## 2018-12-07 LAB — URINALYSIS DIPSTICK REFLEX TO CULTURE
Lab: 1 (ref 1.003–1.035)
Lab: 5 (ref 5.0–8.0)
Lab: NEGATIVE
Lab: NEGATIVE

## 2018-12-07 LAB — CBC AND DIFF
Lab: 0 % (ref 0–2)
Lab: 0 10*3/uL (ref 0–0.20)
Lab: 0 10*3/uL (ref 0–0.45)
Lab: 0.1 10*3/uL (ref 0–0.80)
Lab: 0.1 10*3/uL — ABNORMAL LOW (ref 1.0–4.8)
Lab: 1 % (ref 0–5)
Lab: 10 g/dL — ABNORMAL LOW (ref 12.0–15.0)
Lab: 18 % — ABNORMAL HIGH (ref >60)
Lab: 185 10*3/uL (ref 150–400)
Lab: 2.9 10*3/uL (ref 1.8–7.0)
Lab: 3.1 M/UL — ABNORMAL LOW (ref 4.0–5.0)
Lab: 3.3 K/UL — ABNORMAL LOW (ref 4.5–11.0)
Lab: 32 g/dL — ABNORMAL LOW (ref >60)
Lab: 4 % — ABNORMAL LOW (ref 24–44)
Lab: 5 % (ref 4–12)
Lab: 8.4 FL (ref 7–11)
Lab: 90 % — ABNORMAL HIGH (ref 41–77)
Lab: 96 FL — ABNORMAL LOW (ref 80–100)

## 2018-12-07 LAB — PROTEIN/CR RATIO,UR RAN
Lab: 68 mg/dL
Lab: 71 mg/dL

## 2018-12-07 LAB — COMPREHENSIVE METABOLIC PANEL
Lab: 0.7 mg/dL (ref 0.3–1.2)
Lab: 1.4 mg/dL — ABNORMAL HIGH (ref 0.4–1.00)
Lab: 103 MMOL/L (ref 98–110)
Lab: 130 mg/dL — ABNORMAL HIGH (ref 70–100)
Lab: 138 MMOL/L (ref >60)
Lab: 24 mg/dL (ref 7–25)
Lab: 4.8 MMOL/L (ref >60)
Lab: 5.8 g/dL — ABNORMAL LOW (ref 6.0–8.0)
Lab: 9.2 mg/dL (ref 8.5–10.6)

## 2018-12-07 LAB — URINALYSIS MICROSCOPIC REFLEX TO CULTURE

## 2018-12-07 LAB — PHOSPHORUS: Lab: 2.7 mg/dL (ref 2.0–4.5)

## 2018-12-07 LAB — MAGNESIUM: Lab: 1.3 mg/dL — ABNORMAL LOW (ref 1.6–2.6)

## 2018-12-07 LAB — URIC ACID: Lab: 6.3 mg/dL (ref 2.0–7.0)

## 2018-12-08 LAB — TACROLIMUS LC-MS/MS: Lab: 12

## 2018-12-08 NOTE — Telephone Encounter
Called patient and reviewed lab results from 12/07/18.  Cr 1.44.  Prograf level 12.2. Pt to decrease Prograf dosage from 3mg  BID to 3mg  q am and 2mg  q pm.  Pt aware of dosage change.

## 2018-12-10 ENCOUNTER — Encounter: Admit: 2018-12-10 | Discharge: 2018-12-10 | Payer: MEDICARE

## 2018-12-10 ENCOUNTER — Ambulatory Visit: Admit: 2018-12-10 | Discharge: 2018-12-10 | Payer: MEDICARE

## 2018-12-10 DIAGNOSIS — Z91013 Allergy to seafood: Secondary | ICD-10-CM

## 2018-12-10 DIAGNOSIS — D649 Anemia, unspecified: Secondary | ICD-10-CM

## 2018-12-10 DIAGNOSIS — I749 Embolism and thrombosis of unspecified artery: Secondary | ICD-10-CM

## 2018-12-10 DIAGNOSIS — I150 Renovascular hypertension: Secondary | ICD-10-CM

## 2018-12-10 DIAGNOSIS — N1831 Stage 3a chronic kidney disease: Secondary | ICD-10-CM

## 2018-12-10 DIAGNOSIS — K591 Functional diarrhea: Secondary | ICD-10-CM

## 2018-12-10 DIAGNOSIS — Q613 Polycystic kidney, unspecified: Secondary | ICD-10-CM

## 2018-12-10 DIAGNOSIS — E785 Hyperlipidemia, unspecified: Secondary | ICD-10-CM

## 2018-12-10 DIAGNOSIS — N186 End stage renal disease: Secondary | ICD-10-CM

## 2018-12-10 DIAGNOSIS — D849 Immunodeficiency, unspecified: Secondary | ICD-10-CM

## 2018-12-10 DIAGNOSIS — Z992 Dependence on renal dialysis: Secondary | ICD-10-CM

## 2018-12-10 DIAGNOSIS — Z940 Kidney transplant status: Secondary | ICD-10-CM

## 2018-12-10 DIAGNOSIS — Z9889 Other specified postprocedural states: Secondary | ICD-10-CM

## 2018-12-10 DIAGNOSIS — I10 Essential (primary) hypertension: Principal | ICD-10-CM

## 2018-12-10 LAB — URINALYSIS DIPSTICK REFLEX TO CULTURE
Lab: 5 (ref 5.0–8.0)
Lab: NEGATIVE

## 2018-12-10 LAB — CBC AND DIFF
Lab: 0 % — ABNORMAL LOW (ref >60)
Lab: 0 10*3/uL (ref 0–0.20)
Lab: 0 10*3/uL (ref 0–0.45)
Lab: 0.1 10*3/uL — ABNORMAL LOW (ref 1.0–4.8)
Lab: 0.2 10*3/uL (ref 0–0.80)
Lab: 1 % — ABNORMAL LOW (ref >60)
Lab: 11 g/dL — ABNORMAL LOW (ref 12.0–15.0)
Lab: 19 % — ABNORMAL HIGH (ref 11–15)
Lab: 201 K/UL (ref 150–400)
Lab: 3 % — ABNORMAL LOW (ref 24–44)
Lab: 3.5 M/UL — ABNORMAL LOW (ref 4.0–5.0)
Lab: 32 pg (ref 26–34)
Lab: 33 g/dL (ref 32.0–36.0)
Lab: 34 % — ABNORMAL LOW (ref 36–45)
Lab: 4.2 10*3/uL (ref 1.8–7.0)
Lab: 4.7 10*3/uL (ref 4.5–11.0)
Lab: 6 % (ref 4–12)
Lab: 8.7 FL (ref 7–11)
Lab: 90 % — ABNORMAL HIGH (ref 41–77)
Lab: 96 FL (ref 80–100)

## 2018-12-10 LAB — COMPREHENSIVE METABOLIC PANEL
Lab: 139 MMOL/L (ref 137–147)
Lab: 4.9 MMOL/L (ref 3.5–5.1)

## 2018-12-10 LAB — URINALYSIS MICROSCOPIC REFLEX TO CULTURE

## 2018-12-10 LAB — PROTEIN/CR RATIO,UR RAN
Lab: 26 mg/dL — AB
Lab: 94 mg/dL

## 2018-12-10 LAB — URIC ACID: Lab: 5.8 mg/dL (ref 2.0–7.0)

## 2018-12-10 LAB — MAGNESIUM: Lab: 1.5 mg/dL — ABNORMAL LOW (ref 1.6–2.6)

## 2018-12-10 LAB — PHOSPHORUS: Lab: 3.6 mg/dL (ref 2.0–4.5)

## 2018-12-10 NOTE — Progress Notes
Last Prograf dosage 22:00.  CBC and chemistry back at time of visit.    RTC in 1 week.  Labs weekly.

## 2018-12-10 NOTE — Progress Notes
Labs drawn with SONO and labeled at the bedside.

## 2018-12-10 NOTE — Progress Notes
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  Donor: DCD, Age: 75s, female, Creatinine Initial/Terminal. 1.36/1.04  KDPI: 51%, Left kidney GS 2/151 Mild focal fibrosis  Left kidney into LLQ  cPRA:? 0% HLA MM 2/2/0  Match: VXM Negative, PXM Negative  CMV: D+/R+  Induction: thymoglobulin  Maintenance Immunosuppression: Triple drug therapy  EDW 90 kg  Post OP: Slow graft function  Surgeon: Armanda Magic  Stent Removal: Oct 19th?    TRANSPLANT SYNOPSIS#1:  - Jan/2015 complicated by DGF, CMV viremia, and failed in 2017     Referring Nephrologist:  Lynetta Mare  [address]  [address]  [place] [address]  Phone: [phone]  Fax: [phone]       HPI:    We had the pleasure of meeting Diana Cooley in the [facility] for renal transplant followup.    Accompanied by her spouse.    Last seen in clinic 9/24. She feels well with no new or acute complaints.    Blood pressure normal again.     Occasional nausea but no vomiting, GERD is better after changing famotidine to pantoprazole      Urine output is between 1.5-2 L , drinking about 2 L daily      Right lower leg swelling unchanged. Resolved in am and accumulates during the day. Not painful but noticeably asymmetric. No redness or warmth.    Diarrhea almost resolved. Taking Imodium as needed.     Weight stable at 88 kg.    IS: Pred 10, MPA 180 mg QID, tac 3/2. Usually takes medication at 1000/2200.     REVIEW OF SYSTEMS: Comprehensive 14-point ROS reviewed  Positives noted in HPI otherwise negative.    Past History:  Medical History:   Diagnosis Date   ? Anemia    ? 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 Hemodialysis patient (HCC) 07/03/2011   ? HTN (hypertension)    ? Hyperlipemia    ? Hyperlipidemia 06/27/2011 ? Hypertension 06/27/2011   ? 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 S/P dilatation and curettage    ? Shellfish allergy 07/15/2011       Surgical History:   Procedure Laterality Date   ? HX OOPHORECTOMY Bilateral 2013   ? ALLOTRANSPLANTATION KIDNEY FROM NON LIVING DONOR WITHOUT RECIPIENT NEPHRECTOMY N/A 11/12/2018    Performed by York Cerise, MD at [facility] OR   ? DOPPLER ECHOCARDIOGRAPHY     ? ELECTROCARDIOGRAM     ? HX TUBAL LIGATION         Social History     Socioeconomic History   ? Marital status: Married     Spouse name: Not on file   ? Number of children: 1   ? Years of education: Not on file   ? Highest education level: Not on file   Occupational History     Employer: HOMEMAKER   Tobacco Use   ? Smoking status: Never Smoker   ? Smokeless tobacco: Never Used   Substance and Sexual Activity   ? Alcohol use: No   ? Drug use: No   ? Sexual activity: Not Currently   Other Topics Concern   ? Not on file   Social History  Narrative   ? Not on file       Family History   Problem Relation Age of Onset   ? Diabetes Mother    ? Depression Mother    ? Depression Father    ? Cancer-Breast Neg Hx    ? Cancer-Colon Neg Hx    ? Cancer-Ovarian Neg Hx    ? Cancer-Uterine Neg Hx        Allergies   Allergen Reactions   ? Zofran [Ondansetron Hcl (Pf)] HYPOTENSION   ? Shellfish Containing Products HIVES and SHORTNESS OF BREATH     Pulmonary and loss of consciousness    ? Levaquin [Levofloxacin] NAUSEA AND VOMITING and HYPOTENSION       Current Medications:    Current Outpatient Medications:   ?  acetaminophen (TYLENOL) 325 mg tablet, Take two tablets by mouth every 4 hours as needed., Disp: 60 tablet, Rfl: 1  ?  atorvastatin (LIPITOR) 20 mg tablet, Take 20 mg by mouth at bedtime daily., Disp: , Rfl:   ?  cholecalciferol (VITAMIN D-3) 1,000 units tablet, Take 1,000 Units by mouth twice daily., Disp: , Rfl:   ?  famotidine (PEPCID) 20 mg tablet, Take 20 mg by mouth at bedtime daily., Disp: , Rfl: ?  furosemide (LASIX) 20 mg tablet, Take one tablet by mouth every morning., Disp: 20 tablet, Rfl: 0  ?  loperamide (IMODIUM A-D) 2 mg capsule, Take one capsule by mouth daily. Take 2 capsules by mouth initially, followed by 1 capsule by mouth after each loose stool up to a maximum of 8 tablets in 24 hours., Disp: 30 capsule, Rfl:   ?  mycophenolate DR (MYFORTIC) 180 mg TbEC tablet, Take two tablets by mouth four times daily., Disp: 240 tablet, Rfl: 11  ?  nystatin (MYCOSTATIN) 100,000 units/mL oral suspension, Swish and Swallow 5 mL by mouth as directed four times daily., Disp: 200 mL, Rfl: 0  ?  pantoprazole DR (PROTONIX) 40 mg tablet, Take 40 mg by mouth twice daily., Disp: , Rfl:   ?  predniSONE (DELTASONE) 5 mg tablet, Take 20 mg daily 9/8-9/17, then take 15 mg 9/18-10/2, then 10 mg 10/3-11/1, then take 5 mg daily, Disp: 120 tablet, Rfl: 1  ?  prochlorperazine maleate (COMPAZINE) 10 mg tablet, Take one tablet by mouth every 6 hours as needed for Nausea or Vomiting., Disp: 10 tablet, Rfl: 0  ?  tacrolimus (PROGRAF) 1 mg capsule, Take three capsules by mouth twice daily., Disp: 180 capsule, Rfl: 11  ?  trimethoprim/sulfamethoxazole (BACTRIM) 80/400 mg tablet, Take one tablet by mouth three times weekly., Disp: 30 tablet, Rfl: 11  ?  valGANciclovir (VALCYTE) 450 mg tablet, Take one tablet by mouth daily with breakfast., Disp: 30 tablet, Rfl: 2  ?  venlafaxine (EFFEXOR) 37.5 mg tablet, Take one tablet by mouth daily., Disp: 30 tablet, Rfl: 5  ?  zolpidem (AMBIEN) 5 mg tablet, Take one tablet by mouth at bedtime as needed for Sleep., Disp: 15 tablet, Rfl: 0    Physical Exam:  BP 133/82 (BP Source: Arm, Right Upper, Patient Position: Sitting)  - Pulse 108  - Temp 36.9 ?C (98.4 ?F) (Oral)  - Ht 157.5 cm (62)  - Wt 88.1 kg (194 lb 3.2 oz)  - LMP 03/11/2010 Comment: Bilat. Tubes & Overies - SpO2 100%  - BMI 35.52 kg/m?     General: In NAD; A&Ox3, robust and well appearing  Skin: Warm, dry, no signs of rash HEENT: Grossly normal appearing  Neck:  Supple   CV: Regular, regular rate, normal S1/S2, no murmurs  Lungs: CTA bilaterally in posterior fields  Abd: Grossly normal without rebound, guarding, masses, or bruits. LLQ drain in place with serous drainage.  LLQ: No overlying bruit, tender, incision healing well.    Ext: bruising on arms improving. Right tibial pitting, trace to 1+   Neuro: No focal deficits   Psych: Affect appropriate      Laboratory studies:     CMP:  CMP Latest Ref Rng & Units 12/10/2018 12/07/2018 11/30/2018 11/26/2018 11/23/2018   NA 137 - 147 MMOL/L 139 138 138 136(L) 137   K 3.5 - 5.1 MMOL/L 4.9 4.8 4.1 4.4 4.3   CL 98 - 110 MMOL/L 104 103 102 101 102   CO2 21 - 30 MMOL/L 23 26 24 23 24    GAP 3 - 12 12 9 12 12 11    BUN 7 - 25 MG/DL 21 24 24  37(H) 52(H)   CR 0.4 - 1.00 MG/DL 0.86(V) 7.84(O) 9.62(X) 2.18(H) 2.67(H)   GLUX 70 - 100 MG/DL 528(U) 132(G) 401(U) 272(Z) 118(H)   CA 8.5 - 10.6 MG/DL 9.8 9.2 36.6 9.4 8.9   TP 6.0 - 8.0 G/DL 6.4 4.4(I) 6.4 6.4 3.4(V)   ALB 3.5 - 5.0 G/DL 4.1 3.9 4.0 3.9 3.8   ALKP 25 - 110 U/L 52 46 54 45 40   ALT 7 - 56 U/L 9 6(L) 8 8 8    TBILI 0.3 - 1.2 MG/DL 0.8 0.7 1.1 0.9 0.8   GFR >60 mL/min 37(L) 38(L) 25(L) 23(L) 18(L)   GFRAA >60 mL/min 45(L) 45(L) 30(L) 28(L) 22(L)     Hemoglobin A1C (%)   Date Value   11/26/2018 5.6   11/12/2018 5.3   05/07/2017 5.4     PTH Hormone (PG/ML)   Date Value   11/12/2018 62.9   03/25/2018 559.8 (H)     Lipase   Date Value Ref Range Status   04/28/2013 19 11 - 82 U/L Final   09/25/2012 27 11 - 82 U/L Final     Amylase   Date Value Ref Range Status   04/28/2013 39 24 - 100 U/L Final   09/25/2012 61 24 - 100 U/L Final     BK Virus Plasma Quant (no units)   Date Value   11/19/2018     NOT DETECTED  Reference range: NOT DETECTED  Unit: copies/mL  TESTING PERFORMED AT LOW VOLUME ON PLASMA SPECIMEN FOR BKV, MAY  AFFECT RESULTS.  Marland Kitchen  Assay Range: 39 copies/mL to 1.00E+10 copies/mL  .  The limit of quantitation (LOQ) is 39 copies/mL. BK virus DNA detected below the  LOQ will be reported as Detected:<39 copies/mL.  .  This test was developed and its performance characteristics  determined by  [employer]. It has not been cleared or approved by the U.[place].  Food and Drug  Administration. Results should be used in conjunction with clinical  findings,  and should not form the sole basis for a diagnosis or treatment  decision.  ____________________________________________________________  Testing Performed At:  [facility]  [address]  [place], [place] [address]  [phone]  CLIA ID: [id number]     07/01/2013 Negative for BK Virus   06/03/2013 Negative for BK Virus   05/19/2013 Negative for BK Virus   04/28/2013 Negative for BK Virus     CMV DNA Quant PCR   Date Value   11/19/2018 CMV DNA NOT DETECTED [IU]/mL  11/12/2013 Positive for CMV   10/05/2013 Positive for CMV   09/22/2013 Negative for CMV   09/14/2013 Negative for CMV     IU/mL CMV Blood (no units)   Date Value   11/19/2018     <50 IU/mL  The test method detects and quantitates CMV DNA using the Abbott RealTime assay,   and is approved by the FDA for monitoring hematopoietic stem cell transplant   patients who are undergoing anti-CMV therapy.  Please correlate results with the   clinical status of the patient.       No results found for: COPIES  EBV DNA, Quant. (no units)   Date Value   04/28/2013 Negative for EBV       TACROLIMUS LEVEL:  Tacrolimus Immunoassay (NG/ML)   Date Value   11/17/2018 25.9 (HH)   11/16/2018 12.4   11/15/2018 13.4   11/14/2018 25.0 (HH)   02/12/2016 9.5   12/09/2014 7.8   01/17/2014 6.4   12/31/2013 7.9       CBC with Diff:  CBC with Diff Latest Ref Rng & Units 12/10/2018 12/07/2018   WBC 4.5 - 11.0 K/UL 4.7 3.3(L)   RBC 4.0 - 5.0 M/UL 3.54(L) 3.18(L)   HGB 12.0 - 15.0 GM/DL 11.4(L) 10.1(L)   HCT 36 - 45 % 34.0(L) 30.7(L)   MCV 80 - 100 FL 96.1 96.6   MCH 26 - 34 PG 32.1 31.7   MCHC 32.0 - 36.0 G/DL 16.1 09.6   RDW 11 - 15 % 19.5(H) 18.9(H) PLT 150 - 400 K/UL 201 185   MPV 7 - 11 FL 8.7 8.4   NEUT 41 - 77 % 90(H) 90(H)   ANC 1.8 - 7.0 K/UL 4.23 2.97   LYMA 24 - 44 % 3(L) 4(L)   ALYM 1.0 - 4.8 K/UL 0.16(L) 0.13(L)   MONA 4 - 12 % 6 5   AMONO 0 - 0.80 K/UL 0.28 0.17   EOSA 0 - 5 % 1 1   AEOS 0 - 0.45 K/UL 0.03 0.04   BASA 0 - 2 % 0 0   ABAS 0 - 0.20 K/UL 0.02 0.01     Lab Results   Component Value Date/Time    IRON 112 11/16/2018 05:44 AM    TIBC 228 (L) 11/16/2018 05:44 AM    PSAT 49 (H) 11/16/2018 05:44 AM    FERRITIN 148 11/16/2018 05:44 AM    FERRITIN 263 (H) 03/25/2013 07:41 AM       Urinalysis:  Lab Results   Component Value Date/Time    UCOLOR YELLOW 12/10/2018 09:56 AM    TURBID CLEAR 12/10/2018 09:56 AM    USPGR 1.014 12/10/2018 09:56 AM    UPH 5.0 12/10/2018 09:56 AM    UPROTEIN 1+ (A) 12/10/2018 09:56 AM    UAGLU NEG 12/10/2018 09:56 AM    UKET NEG 12/10/2018 09:56 AM    UBILE NEG 12/10/2018 09:56 AM    UBLD NEG 12/10/2018 09:56 AM    UROB NORMAL 12/10/2018 09:56 AM     Protein/CR ratio (no units)   Date Value   12/10/2018 0.3   12/07/2018 1.0   12/03/2018 0.2   11/30/2018 0.2   11/26/2018 0.4       Imaging:  Results for orders placed during the hospital encounter of 05/07/17   CT ABD/PELV WO CONTRAST    Impression 1. 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 Some of these cystic  renal lesions demonstrate intermediate density, likely representing  intracystic hemorrhage. Evaluation for renal neoplasm is limited in the absence of IV contrast.  2. No abdominal/pelvic inflammatory mass, ascites, or bowel obstruction.  3. Development of a irregular soft tissue nodule in the inferior left breast. Recommend mammography for further evaluation.         Approved by Trey Sailors, M.D. on 05/07/2017 2:26 PM    By my electronic signature, I attest that I have personally reviewed the images for this examination and formulated the interpretations and opinions expressed in this report       Finalized by Leisa Lenz, M.D. on 05/07/2017 2:31 PM. Dictated by Trey Sailors, M.D. on 05/07/2017 2:00 PM.       Results for orders placed during the hospital encounter of 11/12/18   CHEST 2 VIEWS    Impression Minor zones of atelectasis in the left costophrenic angle.    Otherwise stable chest radiograph demonstrating no acute cardiopulmonary abnormalities.       Finalized by Darel Hong, M.D. on 11/12/2018 7:16 AM. Dictated by Darel Hong, M.D. on 11/12/2018 7:15 AM.         Patient Active Problem List    Diagnosis Date Noted   ? GERD (gastroesophageal reflux disease) 11/12/2018   ? Vitamin D deficiency 11/12/2018   ? Obesity 11/12/2018   ? Insomnia 11/12/2018   ? Immunosuppression (HCC) 11/12/2018   ? 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 Acute tubular necrosis (HCC) 05/06/2013   ? Nausea and vomiting 04/28/2013   ? Kidney transplanted 03/11/2013   ? S/P bilateral salpingo-oophorectomy 01/11/2013   ? Ovarian cyst 12/10/2012   ? Abnormal cardiovascular stress test 07/15/2011   ? Shellfish allergy 07/15/2011   ? HTN (hypertension) 07/15/2011   ? Abnormal stress test 07/15/2011   ? Pre-transplant evaluation for ESRD (end stage renal disease) 07/15/2011   ? 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 Hemodialysis patient (HCC) 07/03/2011   ? 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 Hypertension 06/27/2011   ? Hyperlipidemia 06/27/2011   Imaging:    RUS 9/14:  IMPRESSION     1. ?Normal appearance of the renal transplant without evidence of vascular   compromise or hydronephrosis with indwelling nephroureteral stent in   place.   2. ?No significant change in small, mildly complex fluid collection along   the superomedial aspect of the transplant kidney, likely a small hematoma.       Assessment and Plan:  Ms. 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 #) Deceased donor renal transplant: bl Cr tbd  mg/dL  - Good renal graft function. Baseline TBD   - Nadir Cr: 1.44  - Creatinine today is 1.45,   - Stent removal date: 12/28/2018  - U.A mixed with stent in place, urine pcr 0.3     #) Immunosuppression - On maintenance Triple drug therapy  Goal tacrolimus trough first 3 months after transplant is 12-17 ng/mL (MEIA) or 8-12 mcg/L (HPLC/LCMS), HPLC preferred metric.  Currently taking Prograf 3/2 mg, reduced from 3 mg bid recently  Prednisone taper per protocol, currently on 10 mg bid   MPA 180 mg QID. Frequency adjustment due to diarrhea.     #) ID Proph  - CMV Donor(+)/ Recipient(+) to treat with Valcyte for 3 months post transplant  - PJP Prophylax: To treat with Bactrim/Septra for 1 year post transplant    #)  Rt Lower Ext swelling:   - Stable, minimal in morning   - Monitor for now, will consider doppler if worsens    #) BPs:   under acceptable control without medication  Will keep monitoring for now     #) Glycemia - BGs have been Excellent on BMPs  Continue to monitor and taper steroids.  A1c on 9/17 5.6     #) Anemia   - at goal >11    #) Electrolytes:   K acceptable  Mg acceptable without supplementation   PO4 acceptable without supplementation   Ca acceptable, PTH 62.9 on 11/12/2018    #) Health maintenance  Colonoscopy 05/03/2013 focal colitis, diverticulosis. Repeat 2025.  Mammogram 11/2017 normal, repeat annually  Pap smear 06/2017 normal  Flu vaccine recommended at 3 months post.     RTC in 2 weeks  Labs weekly (poor access)     Abd Assalam, MD Date:  12/10/2018   Transplant Fellow                            ATTESTATION           I have personally seen and examined this patient with the fellow, performed the key portions of the E/M visit, discussed the case and concur with the fellow's documentation of history, physical exam, assessment, and treatment plan.       Staff name:  Angelita Ingles, MD Date:  12/10/2018       Cc: Lynetta Mare  Cc: Dierdre Harness Please contact the [facility] at [phone] for any transplant related questions or concerns that may arise.

## 2018-12-11 LAB — TACROLIMUS LC-MS/MS: Lab: 8.8

## 2018-12-14 LAB — URINALYSIS DIPSTICK REFLEX TO CULTURE
Lab: NEGATIVE
Lab: NEGATIVE
Lab: NEGATIVE
Lab: NEGATIVE

## 2018-12-14 LAB — URINALYSIS MICROSCOPIC REFLEX TO CULTURE

## 2018-12-14 LAB — COMPREHENSIVE METABOLIC PANEL
Lab: 1.2 mg/dL — ABNORMAL HIGH (ref 0.4–1.00)
Lab: 10 U/L (ref 7–40)
Lab: 119 mg/dL — ABNORMAL HIGH (ref 70–100)
Lab: 139 MMOL/L — ABNORMAL LOW (ref 137–147)
Lab: 25 MMOL/L (ref 21–30)
Lab: 4.1 g/dL — ABNORMAL LOW (ref 3.5–5.0)
Lab: 4.6 MMOL/L — ABNORMAL LOW (ref 3.5–5.1)
Lab: 55 U/L (ref 25–110)
Lab: 6.3 g/dL (ref 6.0–8.0)

## 2018-12-14 LAB — PHOSPHORUS: Lab: 3.3 mg/dL (ref 2.0–4.5)

## 2018-12-14 LAB — MAGNESIUM: Lab: 1.4 mg/dL — ABNORMAL LOW (ref 1.6–2.6)

## 2018-12-14 LAB — CBC AND DIFF
Lab: 3.7 M/UL — ABNORMAL LOW (ref 4.0–5.0)
Lab: 8.5 10*3/uL (ref 4.5–11.0)

## 2018-12-14 LAB — BK VIRUS DNA, QUANT PLASMA

## 2018-12-14 LAB — URIC ACID: Lab: 6.7 mg/dL (ref 2.0–7.0)

## 2018-12-14 LAB — PROTEIN/CR RATIO,UR RAN
Lab: 29 mg/dL
Lab: 88 mg/dL

## 2018-12-15 LAB — TACROLIMUS LC-MS/MS: Lab: 14 mg/dL — ABNORMAL HIGH (ref 0.3–1.2)

## 2018-12-15 LAB — CMV QUANT PCR-BLOOD

## 2018-12-16 MED ORDER — TACROLIMUS 1 MG PO CAP
2 mg | ORAL_CAPSULE | Freq: Two times a day (BID) | ORAL | 11 refills | 30.00000 days | Status: DC
Start: 2018-12-16 — End: 2018-12-23

## 2018-12-16 NOTE — Telephone Encounter
Reviewed lab results from 12/14/18.  Results released to pt's MyChart.  Cr 1.28.  Prograf level 14.2.  Pt to decrease dosage from 3mg  q am and 2mg  q pm to 2mg  BID.  Pt repeated back dosage change.

## 2018-12-18 ENCOUNTER — Encounter: Admit: 2018-12-18 | Discharge: 2018-12-18 | Payer: Commercial Managed Care - HMO

## 2018-12-21 ENCOUNTER — Encounter: Admit: 2018-12-21 | Discharge: 2018-12-21 | Payer: Commercial Managed Care - HMO

## 2018-12-21 ENCOUNTER — Ambulatory Visit: Admit: 2018-12-21 | Discharge: 2018-12-21 | Payer: MEDICARE

## 2018-12-21 DIAGNOSIS — Z940 Kidney transplant status: Secondary | ICD-10-CM

## 2018-12-21 DIAGNOSIS — D849 Immunodeficiency, unspecified: Secondary | ICD-10-CM

## 2018-12-21 LAB — PROTEIN/CR RATIO,UR RAN
Lab: 29 mg/dL
Lab: 94 mg/dL — ABNORMAL LOW

## 2018-12-21 LAB — URIC ACID: Lab: 6.6 mg/dL (ref 2.0–7.0)

## 2018-12-21 LAB — COMPREHENSIVE METABOLIC PANEL
Lab: 1.2 mg/dL — ABNORMAL HIGH (ref 0.4–1.00)
Lab: 10 K/UL — ABNORMAL LOW (ref 3–12)
Lab: 10 U/L (ref 7–40)
Lab: 12 U/L (ref 7–56)
Lab: 137 mg/dL — ABNORMAL HIGH (ref 70–100)
Lab: 144 MMOL/L (ref 137–147)
Lab: 25 MMOL/L (ref 21–30)
Lab: 4.1 g/dL — ABNORMAL LOW (ref 3.5–5.0)
Lab: 43 mL/min — ABNORMAL LOW (ref >60)
Lab: 5.1 MMOL/L (ref 3.5–5.1)
Lab: 51 U/L (ref 25–110)
Lab: 52 mL/min — ABNORMAL LOW (ref >60)
Lab: 6.2 g/dL (ref 6.0–8.0)

## 2018-12-21 LAB — CBC AND DIFF
Lab: 0 10*3/uL (ref 0–0.20)
Lab: 3.8 M/UL — ABNORMAL LOW (ref 4.0–5.0)
Lab: 6.4 10*3/uL (ref 4.5–11.0)

## 2018-12-21 LAB — URINALYSIS DIPSTICK REFLEX TO CULTURE
Lab: NEGATIVE
Lab: NEGATIVE
Lab: NEGATIVE

## 2018-12-21 LAB — PHOSPHORUS: Lab: 4.3 mg/dL (ref 2.0–4.5)

## 2018-12-21 LAB — URINALYSIS MICROSCOPIC REFLEX TO CULTURE

## 2018-12-21 LAB — TACROLIMUS LC-MS/MS: Lab: 5.2 mg/dL — ABNORMAL HIGH (ref 0.3–1.2)

## 2018-12-21 LAB — MAGNESIUM: Lab: 1.5 mg/dL — ABNORMAL LOW (ref 1.6–2.6)

## 2018-12-21 MED ORDER — VENLAFAXINE 37.5 MG PO TAB
37.5 mg | ORAL_TABLET | Freq: Every day | ORAL | 0 refills | Status: DC
Start: 2018-12-21 — End: 2018-12-22

## 2018-12-21 NOTE — Telephone Encounter
Pt asked to talk to coordinator while here for labs today.  Pt stated she needed refills on Effexor and Ambien because pharmacy had not received authorization for refills.  Pt then stated the refills were sent to other doctors office.  Told pt we would discuss refills with Dr. Caryl Comes.  Dr. Caryl Comes approved refill for Effexor but not at hospital today, so unable to sign prescription for Ambien.  One month prescription sent to Effexor so future refills could be managed by PCP.  Left phone message for PCP office regarding refills.  Ensured they were aware that we sent in just one fill for Effexor and were unable to send in escript for Ambien.  Per O2 and message back, they are working with Dr. Kathe Becton on refill authorizations and have been in contact with pt.  Called patient to explain that PCP will take over refills of Effexor after this fill and that Dr. Caryl Comes not here today to sign Effexor prescription.    Pt upset because when on dialysis Ambien was able to be called into pharmacy.  Explained to pt that Ambien was recently made a schedule 4 drug.  Reviewed lab results from today with pt.  Hgb 12.1.  Cr 1.28.    Prograf level pending.

## 2018-12-21 NOTE — Progress Notes
Pharmacy Managed Valganciclovir   Diana Cooley is status-post deceased kidney transplant on 12-02-18. Patient is on valganciclovir for prophylaxis of CMV after initial transplant.       CMV risk stratification:   Moderate Risk (CMV D+/R+) (verified in [facility]et)      Duration of valganciclovir therapy:   3 months    Expected end date of therapy: 02/11/2019      Laboratory Results  CBC w diff  CBC with Diff Latest Ref Rng & Units 12/21/2018 12/14/2018 12/10/2018 12/07/2018 11/30/2018   WBC 4.5 - 11.0 K/UL 6.4 8.5 4.7 3.3(L) 6.7   RBC 4.0 - 5.0 M/UL 3.81(L) 3.72(L) 3.54(L) 3.18(L) 3.07(L)   HGB 12.0 - 15.0 GM/DL 91.4 11.9(L) 11.4(L) 10.1(L) 9.9(L)   HCT 36 - 45 % 36.8 35.7(L) 34.0(L) 30.7(L) 29.4(L)   MCV 80 - 100 FL 96.4 96.0 96.1 96.6 95.8   MCH 26 - 34 PG 31.6 31.9 32.1 31.7 32.2   MCHC 32.0 - 36.0 G/DL 78.2 95.6 21.3 08.6 57.8   RDW 11 - 15 % 17.9(H) 18.8(H) 19.5(H) 18.9(H) 18.0(H)   PLT 150 - 400 K/UL 175 173 201 185 136(L)   MPV 7 - 11 FL 8.6 8.7 8.7 8.4 9.4   NEUT 41 - 77 % 86(H) 90(H) 90(H) 90(H) 94(H)   ANC 1.8 - 7.0 K/UL 5.56 7.61(H) 4.23 2.97 6.33   LYMA 24 - 44 % 5(L) 4(L) 3(L) 4(L) 2(L)   ALYM 1.0 - 4.8 K/UL 0.29(L) 0.37(L) 0.16(L) 0.13(L) 0.11(L)   MONA 4 - 12 % 7 6 6 5  3(L)   AMONO 0 - 0.80 K/UL 0.47 0.46 0.28 0.17 0.17   EOSA 0 - 5 % 1 0 1 1 1    AEOS 0 - 0.45 K/UL 0.04 0.01 0.03 0.04 0.04   BASA 0 - 2 % 1 0 0 0 0   ABAS 0 - 0.20 K/UL 0.04 0.01 0.02 0.01 0.02       Comprehensive Metabolic Profile  CMP Latest Ref Rng & Units 12/21/2018 12/14/2018 12/10/2018 12/07/2018 11/30/2018   NA 137 - 147 MMOL/L 144 139 139 138 138   K 3.5 - 5.1 MMOL/L 5.1 4.6 4.9 4.8 4.1   CL 98 - 110 MMOL/L 109 103 104 103 102   CO2 21 - 30 MMOL/L 25 25 23 26 24    GAP 3 - 12 10 11 12 9 12    BUN 7 - 25 MG/DL 18 22 21 24 24    CR 0.4 - 1.00 MG/DL 4.69(G) 2.95(M) 8.41(L) 1.44(H) 2.06(H)   GLUX 70 - 100 MG/DL 244(W) 102(V) 253(G) 644(I) 119(H)   CA 8.5 - 10.6 MG/DL 9.7 9.2 9.8 9.2 34.7   TP 6.0 - 8.0 G/DL 6.2 6.3 6.4 4.2(V) 6.4 ALB 3.5 - 5.0 G/DL 4.1 4.1 4.1 3.9 4.0   ALKP 25 - 110 U/L 51 55 52 46 54   ALT 7 - 56 U/L 12 13 9  6(L) 8   TBILI 0.3 - 1.2 MG/DL 0.7 0.8 0.8 0.7 1.1   GFR >60 mL/min 43(L) 43(L) 37(L) 38(L) 25(L)   GFRAA >60 mL/min 52(L) 52(L) 45(L) 45(L) 30(L)       LOG10  CMV   Date Value Ref Range Status   11/19/2018 <1.7  Final     CMV DNA Quant PCR   Date Value Ref Range Status   12/10/2018 PERFORMED AT [facility] CMVND-CMV DNA NOT DETECTED [IU]/mL Final     Comment:     NOT DETECTED  Calculated creatinine clearance based on Cockcroft-Gault Equation: 50.0 ml/min    Assessment / Plan:      Renal function: Stable    Thrombocytopenia: None  Neutropenia: None    CMV Viremia:   No, Continue current dose    Valganciclovir Dose:   Continue Valcyte 450mg  daily      Notified transplant nephrologist and renal transplant coordinator. Transplant coordinator will communicate any dose changes with patient.  Next laboratory check per post-transplant protocol.    Follow up: with next laboratory check or earlier if needed

## 2018-12-22 ENCOUNTER — Encounter: Admit: 2018-12-22 | Discharge: 2018-12-22 | Payer: Commercial Managed Care - HMO

## 2018-12-22 DIAGNOSIS — G4700 Insomnia, unspecified: Secondary | ICD-10-CM

## 2018-12-22 MED ORDER — VENLAFAXINE 37.5 MG PO TAB
37.5 mg | ORAL_TABLET | Freq: Every day | ORAL | 4 refills | Status: AC
Start: 2018-12-22 — End: ?

## 2018-12-22 MED ORDER — ZOLPIDEM 5 MG PO TAB
5 mg | ORAL_TABLET | Freq: Every evening | ORAL | 0 refills | PRN
Start: 2018-12-22 — End: ?

## 2018-12-22 MED ORDER — ZOLPIDEM 5 MG PO TAB
5 mg | ORAL_TABLET | Freq: Every evening | ORAL | 0 refills | 30.00000 days | Status: AC | PRN
Start: 2018-12-22 — End: ?

## 2018-12-22 NOTE — Telephone Encounter
Called and advised patient that Dr. Kathe Becton refilled her zolpidem rx. She then asked if there were any refills, in which I told her there was not. She was not happy that she would have to "go through this again next month". I told her to follow up with office regarding a visit to discuss the medication in ~1 month. She agreed and hung up. Malva Cogan, RN

## 2018-12-22 NOTE — Telephone Encounter
Patient called and LVM to have Dr. Kathe Becton refill her Diana Cooley and requested call back immediately.     Called patient back and spoke with her about a refill for her ambien. She states that she needs this medication filled "right now" because she hasn't slept in "5 days". She also states "I will have to cancel my transplant appointment with Dr. Caryl Comes on Thursday because of the lack of sleep I have gotten". She demanded an exact time as to when she could have this medication in her hand.     I politely advised her that I had sent/routed Dr. Kathe Becton a message after patient had informed us who had previously prescribed this medication. These messages were from this morning 12/22/2018. I explained that I was sorry for the delay in response but that Dr. Kathe Becton was in clinic and has been very busy. She was agitated while speaking with her and wants an answer before Dr. Kathe Becton and I leave clinic today as to when she can have her medication. She was not interested in listening to anything else I had to say. Will talk to PCP before leaving office today and call patient back.Malva Cogan, RN

## 2018-12-23 ENCOUNTER — Encounter: Admit: 2018-12-23 | Discharge: 2018-12-23 | Payer: MEDICARE

## 2018-12-23 DIAGNOSIS — D849 Immunodeficiency, unspecified: Secondary | ICD-10-CM

## 2018-12-23 MED ORDER — TACROLIMUS 1 MG PO CAP
3 mg | ORAL_CAPSULE | Freq: Two times a day (BID) | ORAL | 11 refills | 30.00000 days | Status: DC
Start: 2018-12-23 — End: 2018-12-24

## 2018-12-23 NOTE — Telephone Encounter
Called Diana Cooley to discuss refilling of medication regarding their medication: Tacrolimus . Spoke with patient and she stated she has plenty on hand.    Stevan Born, [facility] Patient Advocate, [facility]ty [facility]  [phone]

## 2018-12-23 NOTE — Telephone Encounter
Called patient regarding lab results from 12/21/18.  Results released to pt's MyChart.  Hgb 12.1.  Cr 1.28.  Prograf level 5.2.  Pt to increase dosage from 2mg  BID to 3mg  BID.  Pt aware of dosage change.

## 2018-12-24 ENCOUNTER — Encounter: Admit: 2018-12-24 | Discharge: 2018-12-24 | Payer: MEDICARE

## 2018-12-24 ENCOUNTER — Ambulatory Visit: Admit: 2018-12-24 | Discharge: 2018-12-25 | Payer: MEDICARE

## 2018-12-24 DIAGNOSIS — D849 Immunodeficiency, unspecified: Secondary | ICD-10-CM

## 2018-12-24 DIAGNOSIS — Z91013 Allergy to seafood: Secondary | ICD-10-CM

## 2018-12-24 DIAGNOSIS — Q613 Polycystic kidney, unspecified: Secondary | ICD-10-CM

## 2018-12-24 DIAGNOSIS — D649 Anemia, unspecified: Secondary | ICD-10-CM

## 2018-12-24 DIAGNOSIS — Z992 Dependence on renal dialysis: Secondary | ICD-10-CM

## 2018-12-24 DIAGNOSIS — N1831 Stage 3a chronic kidney disease: Secondary | ICD-10-CM

## 2018-12-24 DIAGNOSIS — I749 Embolism and thrombosis of unspecified artery: Secondary | ICD-10-CM

## 2018-12-24 DIAGNOSIS — Z9889 Other specified postprocedural states: Secondary | ICD-10-CM

## 2018-12-24 DIAGNOSIS — E785 Hyperlipidemia, unspecified: Secondary | ICD-10-CM

## 2018-12-24 DIAGNOSIS — I150 Renovascular hypertension: Secondary | ICD-10-CM

## 2018-12-24 DIAGNOSIS — Z940 Kidney transplant status: Secondary | ICD-10-CM

## 2018-12-24 DIAGNOSIS — N186 End stage renal disease: Secondary | ICD-10-CM

## 2018-12-24 DIAGNOSIS — I10 Essential (primary) hypertension: Secondary | ICD-10-CM

## 2018-12-24 MED ORDER — LIDOCAINE 5 % TP PTMD
1 | MEDICATED_PATCH | TOPICAL | 1 refills | 30.00000 days | Status: AC
Start: 2018-12-24 — End: ?

## 2018-12-24 MED ORDER — TACROLIMUS 1 MG PO CAP
2 mg | ORAL_CAPSULE | Freq: Two times a day (BID) | ORAL | 11 refills | 30.00000 days | Status: CN
Start: 2018-12-24 — End: ?

## 2018-12-24 MED ORDER — CARVEDILOL 6.25 MG PO TAB
6.25 mg | ORAL_TABLET | Freq: Two times a day (BID) | ORAL | 3 refills | 90.00000 days | Status: AC
Start: 2018-12-24 — End: ?

## 2018-12-24 NOTE — Progress Notes
Labs done prior to clinic.  Prograf level was decreased yesterday before we were aware she missed a dose on Sunday.  Pt to change Prograf back to 2mg  BID.    Will try to change PCP to Dr. Star Age for continuity of care.  Start Coreg 6.25mg  BID.  Lidoderm patches ordered.  Pt to drink 2 liters water daily.  RTC in 2 weeks.  Labs weekly.  Next Tuesday or Wednesday.  Stent removal scheduled for 12/28/18.

## 2018-12-24 NOTE — Progress Notes
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  Donor: DCD, Age: 61s, female, Creatinine Initial/Terminal. 1.36/1.04  KDPI: 51%, Left kidney GS 2/151 Mild focal fibrosis  Left kidney into LLQ  cPRA:? 0% HLA MM 2/2/0  Match: VXM Negative, PXM Negative  CMV: D+/R+  Induction: thymoglobulin  Maintenance Immunosuppression: Triple drug therapy  EDW 90 kg  Post OP: Slow graft function  Surgeon: Armanda Magic  Stent Removal: 10/19?    TRANSPLANT SYNOPSIS#1:  - Jan/2015 complicated by DGF, CMV viremia, and failed in 2017     Referring Nephrologist:  Lynetta Mare  [address] [address]  [address]  [place] [address]  Phone: [phone]  Fax: [phone]       HPI:    We had the pleasure of meeting Ms. Diana Cooley in the [facility] for renal transplant followup.    Accompanied by her spouse.    Labs done prior to clinic.    Prograf level was decreased yesterday before we were aware she missed a dose on Sunday.  Pt to change Prograf back to 2mg  BID.  ?  Will try to change PCP to Dr. Ramonita Lab for continuity of care.    Still tachycardiac. Start Coreg 6.25mg  BID.    Having flank pain, thought to be from native kidneys. Lidoderm patches ordered.    Pt to drink 2 liters water daily.    Stent removal scheduled for 12/28/18.    She feels well with no new or acute complaints.    Blood pressure normal again.     Occasional nausea but no vomiting, GERD is better after changing famotidine to pantoprazole      Urine output is between 1.5-2 L , drinking about 2 L daily.      Diarrhea almost resolved/manageable. Taking Imodium as needed.     Weight stable at 87 kg.    IS: Pred 10, MPA 180 mg QID, tac 3/3 (just changed). Usually takes medication at 1000/2200.     REVIEW OF SYSTEMS: Comprehensive 14-point ROS reviewed  Positives noted in HPI otherwise negative. Past History:  Medical History:   Diagnosis Date   ? Anemia    ? 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 Hemodialysis patient (HCC) 07/03/2011   ? HTN (hypertension)    ? Hyperlipemia    ? Hyperlipidemia 06/27/2011   ? Hypertension 06/27/2011   ? 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 S/P dilatation and curettage    ? Shellfish allergy 07/15/2011       Surgical History:   Procedure Laterality Date   ? HX OOPHORECTOMY Bilateral 2013   ? ALLOTRANSPLANTATION KIDNEY FROM NON LIVING DONOR WITHOUT RECIPIENT NEPHRECTOMY N/A 11/12/2018    Performed by York Cerise, MD at [facility] OR   ? DOPPLER ECHOCARDIOGRAPHY     ? ELECTROCARDIOGRAM     ? HX TUBAL LIGATION         Social History     Socioeconomic History   ? Marital status: Married     Spouse name: Not on file   ? Number of children: 1   ? Years of education: Not on file   ? Highest education level: Not on file   Occupational History  Employer: HOMEMAKER   Tobacco Use   ? Smoking status: Never Smoker   ? Smokeless tobacco: Never Used   Substance and Sexual Activity   ? Alcohol use: No   ? Drug use: No   ? Sexual activity: Not Currently   Other Topics Concern   ? Not on file   Social History Narrative   ? Not on file       Family History   Problem Relation Age of Onset   ? Diabetes Mother    ? Depression Mother    ? Depression Father    ? Cancer-Breast Neg Hx    ? Cancer-Colon Neg Hx    ? Cancer-Ovarian Neg Hx    ? Cancer-Uterine Neg Hx        Allergies   Allergen Reactions   ? Zofran [Ondansetron Hcl (Pf)] HYPOTENSION   ? Shellfish Containing Products HIVES and SHORTNESS OF BREATH     Pulmonary and loss of consciousness    ? Levaquin [Levofloxacin] NAUSEA AND VOMITING and HYPOTENSION       Current Medications:    Current Outpatient Medications:   ?  acetaminophen (TYLENOL) 325 mg tablet, Take two tablets by mouth every 4 hours as needed., Disp: 60 tablet, Rfl: 1 ?  atorvastatin (LIPITOR) 20 mg tablet, Take 20 mg by mouth at bedtime daily., Disp: , Rfl:   ?  carvediloL (COREG) 6.25 mg tablet, Take one tablet by mouth twice daily with meals. Take with food., Disp: 180 tablet, Rfl: 3  ?  cholecalciferol (VITAMIN D-3) 1,000 units tablet, Take 1,000 Units by mouth twice daily., Disp: , Rfl:   ?  famotidine (PEPCID) 20 mg tablet, Take 20 mg by mouth at bedtime daily., Disp: , Rfl:   ?  furosemide (LASIX) 20 mg tablet, Take one tablet by mouth every morning., Disp: 20 tablet, Rfl: 0  ?  lidocaine (LIDODERM) 5 % topical patch, Apply one patch topically to affected area every 24 hours. Apply patch for 12 hours, then remove for 12 hours before repeating., Disp: 30 patch, Rfl: 1  ?  loperamide (IMODIUM A-D) 2 mg capsule, Take one capsule by mouth daily. Take 2 capsules by mouth initially, followed by 1 capsule by mouth after each loose stool up to a maximum of 8 tablets in 24 hours., Disp: 30 capsule, Rfl:   ?  mycophenolate DR (MYFORTIC) 180 mg TbEC tablet, Take two tablets by mouth four times daily., Disp: 240 tablet, Rfl: 11  ?  nystatin (MYCOSTATIN) 100,000 units/mL oral suspension, Swish and Swallow 5 mL by mouth as directed four times daily., Disp: 200 mL, Rfl: 0  ?  pantoprazole DR (PROTONIX) 40 mg tablet, Take 40 mg by mouth twice daily., Disp: , Rfl:   ?  predniSONE (DELTASONE) 5 mg tablet, Take 20 mg daily 9/8-9/17, then take 15 mg 9/18-10/2, then 10 mg 10/3-11/1, then take 5 mg daily, Disp: 120 tablet, Rfl: 1  ?  prochlorperazine maleate (COMPAZINE) 10 mg tablet, Take one tablet by mouth every 6 hours as needed for Nausea or Vomiting., Disp: 10 tablet, Rfl: 0  ?  tacrolimus (PROGRAF) 1 mg capsule, Take two capsules by mouth twice daily., Disp: 120 capsule, Rfl: 11  ?  trimethoprim/sulfamethoxazole (BACTRIM) 80/400 mg tablet, Take one tablet by mouth three times weekly., Disp: 30 tablet, Rfl: 11  ?  valGANciclovir (VALCYTE) 450 mg tablet, Take one tablet by mouth daily with breakfast., Disp: 30 tablet, Rfl: 2  ?  venlafaxine (EFFEXOR) 37.5 mg  tablet, Take one tablet by mouth daily. Take with food., Disp: 30 tablet, Rfl: 4  ?  zolpidem (AMBIEN) 5 mg tablet, Take one tablet by mouth at bedtime as needed for Sleep., Disp: 30 tablet, Rfl: 0    Physical Exam:  BP 137/79 (BP Source: Arm, Right Upper, Patient Position: Standing)  - Pulse (!) 121  - Temp 36.9 ?C (98.5 ?F) (Oral)  - Resp 19  - Ht 157.5 cm (62)  - Wt 87 kg (191 lb 12.8 oz)  - LMP 03/11/2010 Comment: Bilat. Tubes & Overies - SpO2 96%  - BMI 35.08 kg/m?     General: In NAD; A&Ox3, robust and well appearing  Skin: Warm, dry, no signs of rash   HEENT: Grossly normal appearing  Neck: Supple   CV: Regular, regular rate, normal S1/S2, no murmurs  Lungs: CTA bilaterally in posterior fields  Abd: Grossly normal without rebound, guarding, masses, or bruits.  TTP at flanks  LLQ: No overlying bruit, tender, incision healing well.    Ext: bruising on arms improving. Right tibial pitting, trace  Neuro: No focal deficits   Psych: Affect appropriate      Laboratory studies:     CMP:  CMP Latest Ref Rng & Units 12/21/2018 12/14/2018 12/10/2018 12/07/2018 11/30/2018   NA 137 - 147 MMOL/L 144 139 139 138 138   K 3.5 - 5.1 MMOL/L 5.1 4.6 4.9 4.8 4.1   CL 98 - 110 MMOL/L 109 103 104 103 102   CO2 21 - 30 MMOL/L 25 25 23 26 24    GAP 3 - 12 10 11 12 9 12    BUN 7 - 25 MG/DL 18 22 21 24 24    CR 0.4 - 1.00 MG/DL 1.61(W) 9.60(A) 5.40(J) 1.44(H) 2.06(H)   GLUX 70 - 100 MG/DL 811(B) 147(W) 295(A) 213(Y) 119(H)   CA 8.5 - 10.6 MG/DL 9.7 9.2 9.8 9.2 86.5   TP 6.0 - 8.0 G/DL 6.2 6.3 6.4 7.8(I) 6.4   ALB 3.5 - 5.0 G/DL 4.1 4.1 4.1 3.9 4.0   ALKP 25 - 110 U/L 51 55 52 46 54   ALT 7 - 56 U/L 12 13 9  6(L) 8   TBILI 0.3 - 1.2 MG/DL 0.7 0.8 0.8 0.7 1.1   GFR >60 mL/min 43(L) 43(L) 37(L) 38(L) 25(L)   GFRAA >60 mL/min 52(L) 52(L) 45(L) 45(L) 30(L)     Hemoglobin A1C (%)   Date Value   11/26/2018 5.6   11/12/2018 5.3   05/07/2017 5.4     PTH Hormone (PG/ML) Date Value   11/12/2018 62.9   03/25/2018 559.8 (H)     Lipase   Date Value Ref Range Status   04/28/2013 19 11 - 82 U/L Final   09/25/2012 27 11 - 82 U/L Final     Amylase   Date Value Ref Range Status   04/28/2013 39 24 - 100 U/L Final   09/25/2012 61 24 - 100 U/L Final     BK Virus Plasma Quant (no units)   Date Value   12/10/2018     NOT DETECTED  Reference range: NOT DETECTED  Unit: copies/mL  .  Assay Range: 39 copies/mL to 1.00E+10 copies/mL  .  The limit of quantitation (LOQ) is 39 copies/mL. BK virus DNA  detected below the  LOQ will be reported as Detected:<39 copies/mL.  .  This test was developed and its performance characteristics  determined by  [employer]. It has not been cleared or approved by the  U[place]  Food and Drug  Administration. Results should be used in conjunction with clinical  findings,  and should not form the sole basis for a diagnosis or treatment  decision.  ____________________________________________________________  Testing Performed At:  [facility]  [address]  [place], [place] [address]  [phone]  CLIA ID: [id number]     11/19/2018     NOT DETECTED  Reference range: NOT DETECTED  Unit: copies/mL  TESTING PERFORMED AT LOW VOLUME ON PLASMA SPECIMEN FOR BKV, MAY  AFFECT RESULTS.  Marland Kitchen  Assay Range: 39 copies/mL to 1.00E+10 copies/mL  .  The limit of quantitation (LOQ) is 39 copies/mL. BK virus DNA  detected below the  LOQ will be reported as Detected:<39 copies/mL.  .  This test was developed and its performance characteristics  determined by  [employer]. It has not been cleared or approved by the U.S.  Food and Drug  Administration. Results should be used in conjunction with clinical  findings,  and should not form the sole basis for a diagnosis or treatment  decision.  ____________________________________________________________  Testing Performed At:  [facility]  [address]  [place], [place] [address]  [phone] CLIA ID: [id number]     07/01/2013 Negative for BK Virus   06/03/2013 Negative for BK Virus   05/19/2013 Negative for BK Virus     CMV DNA Quant PCR   Date Value   12/10/2018 PERFORMED AT [facility] [IU]/mL   11/19/2018 CMV DNA NOT DETECTED [IU]/mL   11/12/2013 Positive for CMV   10/05/2013 Positive for CMV   09/22/2013 Negative for CMV     IU/mL CMV Blood (no units)   Date Value   11/19/2018     <50 IU/mL  The test method detects and quantitates CMV DNA using the Abbott RealTime assay,   and is approved by the FDA for monitoring hematopoietic stem cell transplant   patients who are undergoing anti-CMV therapy.  Please correlate results with the   clinical status of the patient.       No results found for: COPIES  EBV DNA, Quant. (no units)   Date Value   04/28/2013 Negative for EBV       TACROLIMUS LEVEL:  Tacrolimus Immunoassay (NG/ML)   Date Value   11/17/2018 25.9 (HH)   11/16/2018 12.4   11/15/2018 13.4   11/14/2018 25.0 (HH)   02/12/2016 9.5   12/09/2014 7.8   01/17/2014 6.4   12/31/2013 7.9       CBC with Diff:  CBC with Diff Latest Ref Rng & Units 12/21/2018 12/14/2018   WBC 4.5 - 11.0 K/UL 6.4 8.5   RBC 4.0 - 5.0 M/UL 3.81(L) 3.72(L)   HGB 12.0 - 15.0 GM/DL 02.7 11.9(L)   HCT 36 - 45 % 36.8 35.7(L)   MCV 80 - 100 FL 96.4 96.0   MCH 26 - 34 PG 31.6 31.9   MCHC 32.0 - 36.0 G/DL 25.3 66.4   RDW 11 - 15 % 17.9(H) 18.8(H)   PLT 150 - 400 K/UL 175 173   MPV 7 - 11 FL 8.6 8.7   NEUT 41 - 77 % 86(H) 90(H)   ANC 1.8 - 7.0 K/UL 5.56 7.61(H)   LYMA 24 - 44 % 5(L) 4(L)   ALYM 1.0 - 4.8 K/UL 0.29(L) 0.37(L)   MONA 4 - 12 % 7 6   AMONO 0 - 0.80 K/UL 0.47 0.46   EOSA 0 - 5 % 1 0  AEOS 0 - 0.45 K/UL 0.04 0.01   BASA 0 - 2 % 1 0   ABAS 0 - 0.20 K/UL 0.04 0.01     Lab Results   Component Value Date/Time    IRON 112 11/16/2018 05:44 AM    TIBC 228 (L) 11/16/2018 05:44 AM    PSAT 49 (H) 11/16/2018 05:44 AM    FERRITIN 148 11/16/2018 05:44 AM    FERRITIN 263 (H) 03/25/2013 07:41 AM       Urinalysis:  Lab Results Component Value Date/Time    UCOLOR YELLOW 12/21/2018 08:54 AM    TURBID CLEAR 12/21/2018 08:54 AM    USPGR 1.014 12/21/2018 08:54 AM    UPH 5.0 12/21/2018 08:54 AM    UPROTEIN 1+ (A) 12/21/2018 08:54 AM    UAGLU NEG 12/21/2018 08:54 AM    UKET NEG 12/21/2018 08:54 AM    UBILE NEG 12/21/2018 08:54 AM    UBLD 1+ (A) 12/21/2018 08:54 AM    UROB NORMAL 12/21/2018 08:54 AM     Protein/CR ratio (no units)   Date Value   12/21/2018 0.3   12/14/2018 0.3   12/10/2018 0.3   12/07/2018 1.0   12/03/2018 0.2       Imaging:  Results for orders placed during the hospital encounter of 05/07/17   CT ABD/PELV WO CONTRAST    Impression 1. 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 Some of these cystic renal lesions demonstrate intermediate density, likely representing  intracystic hemorrhage. Evaluation for renal neoplasm is limited in the absence of IV contrast.  2. No abdominal/pelvic inflammatory mass, ascites, or bowel obstruction.  3. Development of a irregular soft tissue nodule in the inferior left breast. Recommend mammography for further evaluation.         Approved by Trey Sailors, M.D. on 05/07/2017 2:26 PM    By my electronic signature, I attest that I have personally reviewed the images for this examination and formulated the interpretations and opinions expressed in this report       Finalized by Leisa Lenz, M.D. on 05/07/2017 2:31 PM. Dictated by Trey Sailors, M.D. on 05/07/2017 2:00 PM.       Results for orders placed during the hospital encounter of 11/12/18   CHEST 2 VIEWS    Impression Minor zones of atelectasis in the left costophrenic angle.    Otherwise stable chest radiograph demonstrating no acute cardiopulmonary abnormalities.       Finalized by Darel Hong, M.D. on 11/12/2018 7:16 AM. Dictated by Darel Hong, M.D. on 11/12/2018 7:15 AM.         Patient Active Problem List    Diagnosis Date Noted ? GERD (gastroesophageal reflux disease) 11/12/2018   ? Vitamin D deficiency 11/12/2018   ? Obesity 11/12/2018   ? Insomnia 11/12/2018   ? Immunosuppression (HCC) 11/12/2018   ? 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 Acute tubular necrosis (HCC) 05/06/2013   ? Nausea and vomiting 04/28/2013   ? Kidney transplanted 03/11/2013   ? S/P bilateral salpingo-oophorectomy 01/11/2013   ? Ovarian cyst 12/10/2012   ? Abnormal cardiovascular stress test 07/15/2011   ? Shellfish allergy 07/15/2011   ? HTN (hypertension) 07/15/2011   ? Abnormal stress test 07/15/2011   ? Pre-transplant evaluation for ESRD (end stage renal disease) 07/15/2011   ? 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 Hemodialysis patient (HCC) 07/03/2011   ? Polycystic kidney disease  06/27/2011   ? Hypertension 06/27/2011   ? Hyperlipidemia 06/27/2011   Imaging:    RUS 9/14:  IMPRESSION     1. ?Normal appearance of the renal transplant without evidence of vascular   compromise or hydronephrosis with indwelling nephroureteral stent in   place.   2. ?No significant change in small, mildly complex fluid collection along   the superomedial aspect of the transplant kidney, likely a small hematoma.       Assessment and Plan:  Ms. 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    #) Deceased donor renal transplant: bl Cr tbd  mg/dL  - Good renal graft function. Baseline TBD   - Nadir Cr: 1.3  - Creatinine today is 1.3   - Stent removal date: 12/28/2018  - U.A mixed with stent in place, urine pcr 0.3     #) Immunosuppression - On maintenance Triple drug therapy  Goal tacrolimus trough first 3 months after transplant is 12-17 ng/mL (MEIA) or 8-12 mcg/L (HPLC/LCMS), HPLC preferred metric.  Currently taking Prograf 3/3 mg, missed dose. Can resume 2 mg BID.  Prednisone taper per protocol, currently on 10 mg bid   MPA 180 mg QID. Frequency adjustment due to diarrhea. #) ID Proph  - CMV Donor(+)/ Recipient(+) to treat with Valcyte for 3 months post transplant  - PJP Prophylax: To treat with Bactrim/Septra for 1 year post transplant    #) Rt Lower Ext swelling:   - Stable  - Monitor for now, will consider doppler if worsens    #) BPs:   Above goal. Start carvedilol 6.25 mg BID    #) Glycemia - BGs have been Excellent on BMPs  Continue to monitor and taper steroids.  A1c on 9/17 5.6     #) Anemia   - at goal >11    #) Electrolytes:   K acceptable  Mg acceptable without supplementation   PO4 acceptable without supplementation   Ca acceptable, PTH 62.9 on 11/12/2018    #) Health maintenance  Colonoscopy 05/03/2013 focal colitis, diverticulosis. Repeat 2025.  Mammogram 11/2017 normal, repeat annually  Pap smear 06/2017 normal  Flu vaccine recommended at 3 months post.     RTC in 2 weeks  Labs weekly (poor access)- can repeat 1-2 days after stent removal          Staff name:  Angelita Ingles, MD Date:  12/24/2018       Cc: Lynetta Mare  Cc: Dierdre Harness    Please contact the [facility] at [phone] for any transplant related questions or concerns that may arise.

## 2018-12-25 ENCOUNTER — Encounter: Admit: 2018-12-25 | Discharge: 2018-12-25 | Payer: MEDICARE

## 2018-12-25 NOTE — Progress Notes
Submitted PA for Lidocaine 5% patches - PA approved

## 2018-12-28 ENCOUNTER — Encounter: Admit: 2018-12-28 | Discharge: 2018-12-28 | Payer: MEDICARE

## 2018-12-28 NOTE — Progress Notes
Patients husband called.  Pt was feeling fine yesterday evening.  Per husband, she did her medication pill box for the week and when walked to kithcen (approx 20 feet) to chair.  Looked like she was going unconscious or having a seizure.  Was dizzy.  BP was not measurable on home BP machine.  Was having difficulty breathing, pulse was pounding.  Ambulance came.  SBP 80's.  EMT initiated IVF and gave her something to help her relax.  Pt coded in ambulance before they left for hospital.  They regained pulse.  Pt pt's husband he was told by doctors that she coded again in hospital [facility]).  Told "once Epi wore off, they would loose her again".    Hospital did not want to do autopsy.  They told pt's husband to call if he wanted to do autopsy.

## 2018-12-30 ENCOUNTER — Encounter: Admit: 2018-12-30 | Discharge: 2018-12-30 | Payer: MEDICARE

## 2018-12-31 ENCOUNTER — Encounter: Admit: 2018-12-31 | Discharge: 2018-12-31 | Payer: MEDICARE

## 2018-12-31 NOTE — Progress Notes
SW mailed [id number] form to the [employer], copy to be scanned into the EMR.    Lynetta Mare, LMSW

## 2019-01-01 ENCOUNTER — Encounter: Admit: 2019-01-01 | Discharge: 2019-01-01 | Payer: MEDICARE

## 2019-01-07 ENCOUNTER — Encounter: Admit: 2019-01-07 | Discharge: 2019-01-07 | Payer: MEDICARE

## 2019-01-10 DEATH — deceased

## 2019-04-28 ENCOUNTER — Encounter: Admit: 2019-04-28 | Discharge: 2019-04-28 | Payer: MEDICARE

## 2020-12-15 IMAGING — CR CHEST
1 series · 1 of 1 positions shown · non-contrast
Comparison: none

[chest port x-wise]
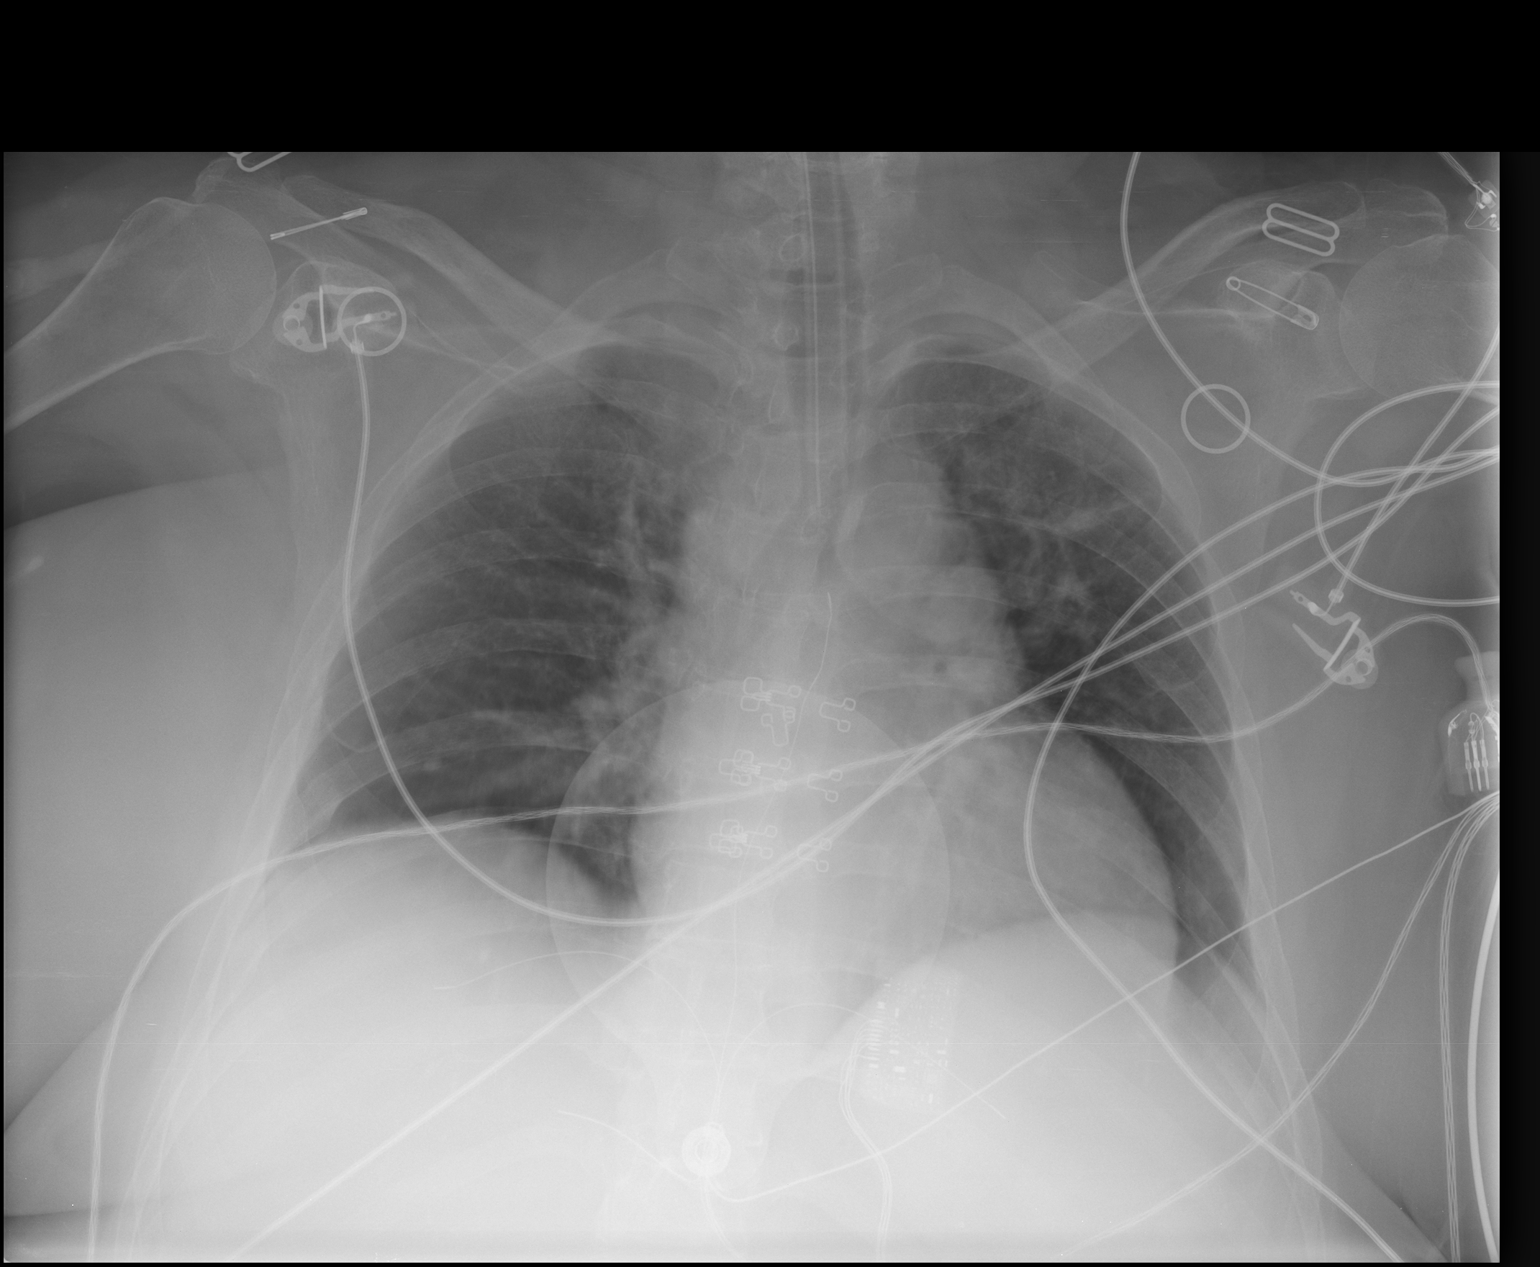

[1 of 1 positions shown; findings below may reference images not displayed]

EXAM

Portable chest x-ray.

INDICATION

tube placement
Check tube placement. Cardiac arrest. CC

FINDINGS

A single portable view of the chest was obtained.

ET tube tip is 2.5 centimeters above the carina.

There is mild cardiomegaly.

There are mild interstitial opacities in the mid to upper portions of the lungs.  There is no
pulmonary consolidation.  There is no pneumothorax.

IMPRESSION

ET tube tip is 2.5 centimeters above the carina.  There is mild cardiomegaly.  There are
interstitial opacities in the mid to upper portions of the lungs.

Tech Notes:

Check tube placement. Cardiac arrest. CC

## 2021-04-25 ENCOUNTER — Encounter: Admit: 2021-04-25 | Discharge: 2021-04-25 | Payer: MEDICARE

## 2021-11-29 ENCOUNTER — Encounter: Admit: 2021-11-29 | Discharge: 2021-11-29 | Payer: MEDICARE

## 2022-01-02 ENCOUNTER — Encounter: Admit: 2022-01-02 | Discharge: 2022-01-02 | Payer: MEDICARE

## 2023-11-18 ENCOUNTER — Encounter: Admit: 2023-11-18 | Discharge: 2023-11-18 | Payer: MEDICARE
